# Patient Record
Sex: Female | Born: 1935
Health system: Southern US, Community
[De-identification: ages and names within clinical notes are randomized; demographics above are authoritative.]

## PROBLEM LIST (undated history)

## (undated) DIAGNOSIS — F329 Major depressive disorder, single episode, unspecified: Secondary | ICD-10-CM

## (undated) DIAGNOSIS — F32A Depression, unspecified: Secondary | ICD-10-CM

## (undated) DIAGNOSIS — Z9889 Other specified postprocedural states: Secondary | ICD-10-CM

## (undated) DIAGNOSIS — F419 Anxiety disorder, unspecified: Secondary | ICD-10-CM

## (undated) DIAGNOSIS — R112 Nausea with vomiting, unspecified: Secondary | ICD-10-CM

## (undated) HISTORY — PX: WISDOM TOOTH EXTRACTION: SHX21

## (undated) HISTORY — PX: TONSILLECTOMY: SUR1361

## (undated) HISTORY — PX: ABDOMINAL HYSTERECTOMY: SHX81

---

## 1999-06-18 ENCOUNTER — Other Ambulatory Visit: Admission: RE | Admit: 1999-06-18 | Discharge: 1999-06-18 | Payer: Self-pay | Admitting: Obstetrics and Gynecology

## 2000-05-04 ENCOUNTER — Other Ambulatory Visit: Admission: RE | Admit: 2000-05-04 | Discharge: 2000-05-04 | Payer: Self-pay | Admitting: Obstetrics and Gynecology

## 2002-01-17 ENCOUNTER — Encounter (INDEPENDENT_AMBULATORY_CARE_PROVIDER_SITE_OTHER): Payer: Self-pay | Admitting: Specialist

## 2002-01-17 ENCOUNTER — Ambulatory Visit (HOSPITAL_COMMUNITY): Admission: RE | Admit: 2002-01-17 | Discharge: 2002-01-17 | Payer: Self-pay | Admitting: Gastroenterology

## 2004-01-06 ENCOUNTER — Encounter: Admission: RE | Admit: 2004-01-06 | Discharge: 2004-01-06 | Payer: Self-pay | Admitting: Otolaryngology

## 2004-08-10 ENCOUNTER — Ambulatory Visit: Payer: Self-pay | Admitting: Internal Medicine

## 2004-09-04 ENCOUNTER — Ambulatory Visit: Payer: Self-pay | Admitting: Internal Medicine

## 2004-11-03 ENCOUNTER — Ambulatory Visit: Payer: Self-pay | Admitting: Internal Medicine

## 2004-12-25 ENCOUNTER — Ambulatory Visit: Payer: Self-pay | Admitting: Internal Medicine

## 2005-02-02 ENCOUNTER — Ambulatory Visit: Payer: Self-pay | Admitting: Internal Medicine

## 2005-04-01 ENCOUNTER — Ambulatory Visit: Payer: Self-pay | Admitting: Internal Medicine

## 2005-04-06 ENCOUNTER — Ambulatory Visit: Payer: Self-pay | Admitting: Internal Medicine

## 2005-05-03 ENCOUNTER — Ambulatory Visit: Payer: Self-pay | Admitting: Internal Medicine

## 2005-07-06 ENCOUNTER — Ambulatory Visit: Payer: Self-pay | Admitting: Internal Medicine

## 2005-08-23 ENCOUNTER — Ambulatory Visit: Payer: Self-pay | Admitting: Internal Medicine

## 2005-08-26 ENCOUNTER — Ambulatory Visit: Payer: Self-pay | Admitting: Internal Medicine

## 2005-12-02 ENCOUNTER — Ambulatory Visit: Payer: Self-pay | Admitting: Internal Medicine

## 2006-02-14 ENCOUNTER — Ambulatory Visit: Payer: Self-pay | Admitting: Internal Medicine

## 2006-04-05 ENCOUNTER — Ambulatory Visit: Payer: Self-pay | Admitting: Internal Medicine

## 2006-07-22 ENCOUNTER — Ambulatory Visit: Payer: Self-pay | Admitting: Internal Medicine

## 2006-07-25 ENCOUNTER — Ambulatory Visit: Payer: Self-pay | Admitting: Internal Medicine

## 2006-11-21 ENCOUNTER — Ambulatory Visit: Payer: Self-pay | Admitting: Internal Medicine

## 2007-02-01 ENCOUNTER — Ambulatory Visit: Payer: Self-pay | Admitting: Internal Medicine

## 2007-05-25 ENCOUNTER — Ambulatory Visit: Payer: Self-pay | Admitting: Internal Medicine

## 2007-11-22 DIAGNOSIS — J329 Chronic sinusitis, unspecified: Secondary | ICD-10-CM | POA: Insufficient documentation

## 2007-11-22 DIAGNOSIS — J309 Allergic rhinitis, unspecified: Secondary | ICD-10-CM | POA: Insufficient documentation

## 2007-11-23 ENCOUNTER — Ambulatory Visit: Payer: Self-pay | Admitting: Internal Medicine

## 2007-11-23 DIAGNOSIS — H6980 Other specified disorders of Eustachian tube, unspecified ear: Secondary | ICD-10-CM

## 2007-12-13 ENCOUNTER — Telehealth (INDEPENDENT_AMBULATORY_CARE_PROVIDER_SITE_OTHER): Payer: Self-pay | Admitting: *Deleted

## 2007-12-18 ENCOUNTER — Encounter: Payer: Self-pay | Admitting: Internal Medicine

## 2007-12-22 ENCOUNTER — Encounter: Payer: Self-pay | Admitting: Internal Medicine

## 2007-12-25 ENCOUNTER — Telehealth (INDEPENDENT_AMBULATORY_CARE_PROVIDER_SITE_OTHER): Payer: Self-pay | Admitting: *Deleted

## 2007-12-26 ENCOUNTER — Encounter: Payer: Self-pay | Admitting: Internal Medicine

## 2008-12-30 ENCOUNTER — Ambulatory Visit: Payer: Self-pay | Admitting: Internal Medicine

## 2008-12-30 DIAGNOSIS — E785 Hyperlipidemia, unspecified: Secondary | ICD-10-CM

## 2010-11-06 ENCOUNTER — Other Ambulatory Visit: Payer: Self-pay | Admitting: Dermatology

## 2010-12-01 NOTE — Assessment & Plan Note (Signed)
[facility]                             PULMONARY OFFICE NOTE   NAME:Ashley Mcneil, Ashley Mcneil                   MRN:          [id number]  DATE:05/25/2007                            DOB:          10-20-1935    PROBLEM:  1. Allergic rhinitis,.  2. Globus sensation and postnasal drainage.  3. Questionable occult esophageal reflux.   HISTORY:  Ashley Mcneil continues to be quite fixated on her sensation of  postnasal drip.  She describes getting up in the middle of the night and  working her throat to bring up some thick white mucous.  She had tried a  Z-pak and Mucinex without much benefit.  As we sit here this afternoon  she says she is so full of congestion and mess right this minute,  although it is not apparent in conversation.  She did not try saline  lavage as discussed after last visit but is interested now after a  patient suggested in the waiting room that it would be worthwhile.  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   MEDICATIONS:  1. She has continued allergy vaccine at 1:10.  2. Amitriptyline 10 mg for her interstitial cystitis.  3. Sinofresh nasal rinse which she considers her most useful therapy.  4. Paxil.  5. Restoril 15 mg.  6. Allegra 180 mg used intermittently.  7. Lovastatin 20 mg.  8. She does have an Epipen and has used Benadryl and Sudafed p.r.n.   ALLERGIES:  DRUG INTOLERANT TO PENICILLIN.   OBJECTIVE:  VITAL SIGNS:  Weight 124 pounds.  Blood pressure 118/60.  Pulse 87.  Room air saturation 98%.  HEENT:  Nasal mucosa and pharynx look normal.  Voice is unremarkable to  me with no stridor.  There is no visible pharyngeal irritation or  postnasal drip.  Conjunctivae are clear.  NECK:  There is no adenopathy or thyromegaly.  LUNG:  Lung fields are clear.  HEART:  Heart sounds normal.  GENERAL:  She does not cough or clear her throat during the time  I am  with her.   IMPRESSION:  She has gotten fixated on this and I even suggested some  distracting therapies including acupuncture and hypnosis. There may be a  component of wet throat but we had not been able to document formal  laryngopharyngeal reflux in the past.  There has been evidence of  esophageal reflux and she had seen Dr. Lazarus Salines in 2005 with  recommendation that she go back on b.i.d. Prevacid.  He had questioned  seasonal allergic rhinitis, probable reflux, possible habitual throat  clearing at that time.  Skin testing has usually been positive for some  common environmental allergens.  CT scan of the sinuses had been  negative and a swallowing evaluation done through Dr. Raye Sorrow office  reportedly was negative.   PLAN:  I have suggested she stop all of her med's except maybe the  Sinofresh and see how she feels.  I have suggested that she at least try  reducing the  Allegra to every other day.  She has a pamphlet and is  going to try a Neti pot with saline lavage per directions.  I have  raised the option of referral to a speech and swallowing second opinion  perhaps at [facility] and we have scheduled return now for six months,  earlier p.r.n.     Clinton D. Maple Hudson, MD, Tonny Bollman, FACP  Electronically Signed    CDY/MedQ  DD: 05/25/2007  DT: 05/26/2007  Job #: [id number]   cc:   Gloris Manchester. Lazarus Salines, M.D.  Jamison Neighbor, M.D.  Barry Dienes Eloise Harman, M.D.

## 2010-12-04 NOTE — Assessment & Plan Note (Signed)
[facility]                             PULMONARY OFFICE NOTE   NAME:Ashley Mcneil, Ashley Mcneil                   MRN:          [id number]  DATE:11/21/2006                            DOB:          April 23, 1936    PROBLEMS:  1. Allergic rhinitis.  2. Globus sensation and post nasal drainage.  3. Questionable occult esophageal reflux.   HISTORY:  She returns for follow-up this spring having found Xyzal made  no difference and no better than other antihistamines.  She had been  trying salt water spritz occasionally but never tried saline lavage as  we had discussed.  She says acid reflux work-up and medication trials  did not help.  Her complaint continues to be that she will wake with a  feeling of thick phlegm in the back of her throat.  She continues  allergy vaccine at 1 to 10 with no problems and we talked some today  about whether that was doing any measurable benefit for her.  She really  likes SinoFresh but is having trouble finding it.  We discussed  alternatives.   MEDICATIONS:  1. Allergy vaccine.  2. Amitriptyline 10 mg for interstitial cystitis.  3. Saline nasal spray.  4. SinoFresh b.i.d.  5. Paxil.  6. Benadryl p.r.n.  7. Occasional Sudafed.   DRUG INTOLERANCES:  PENICILLIN.   OBJECTIVE:  VITAL SIGNS:  Weight 114 pounds, blood pressure 112/62,  pulse 69, room air saturation 97%.  HEENT:  Voice quality is normal.  She breaths comfortably through her  nose.  Pharyngeal mucosal looks completely normal.  There is no stridor,  no adenopathy   IMPRESSION:  She is very sensitive to the feeling of mucus in her  throat.  I am not sure I can tie her complaint to objective findings.   PLAN:  1. Retry sample of Veramyst at her request, two sprays each nostril      once daily.  2. She is going to explore combinations of Mucinex with either an      antihistamine or a decongestant.  3. Try keeping bits of dry bread or cracker at the bedside to  help      with swallowing if she does wake and get up during the night as a      way to clear her throat mechanically.  4. We discussed saline nasal lavage in detail for trial.  5. Schedule return in six months, earlier p.r.n.     Clinton D. Maple Hudson, MD, Tonny Bollman, FACP  Electronically Signed    CDY/MedQ  DD: 11/21/2006  DT: 11/22/2006  Job #: [id number]   cc:   Geoffry Paradise, M.D.

## 2010-12-04 NOTE — Assessment & Plan Note (Signed)
[facility]                               PULMONARY OFFICE NOTE   NAME:Ashley Mcneil, Ashley Mcneil                   MRN:          [id number]  DATE:04/05/2006                            DOB:          [birth year]    PROBLEM LIST:  1. Allergic rhinitis.  2. Globus sensation and postnasal drainage.  3. Questionable occult esophageal reflux.   HISTORY:  Again complains of a sensation of chronic ongoing postnasal drip  and need to clear her throat.  She has been using an over-the-counter  Sudafed antihistamine-type combination.  She says this has been somewhat  different in the past year and she brings up herself again the question of  pharyngeal reflux which has never been confirmed.  She has begun using  Mucinex and water.  We discussed her history of several CT scans of the  sinuses, all negative.  She very much likes Sinofresh as a nasal spray and  rinse.   MEDICATIONS:  Allergy vaccine at 1:10.  She gives her own. We have again  reviewed risks and options, discussed policy concerning administration  outside of a medical office, anaphylaxis, epinephrine and refilled an  epinephrine pen for her.  Amitriptyline 10 mg for interstitial cystitis.  Sinofresh b.i.d., Nasalcrom, Benadryl or other antihistamine p.r.n.,  phenylephrine epi pen.   DRUG INTOLERANCE:  PENICILLIN.   OBJECTIVE:  Weight 124 pounds.  BP 100/60, pulse regular rate, room air  saturation 97%.  Posterior pharyngeal wall is rather glandular and nodular  without erythema or visible drainage.  There is no adenopathy.  Nasal airway  is not obstructed now.  Lungs are clear.  Voice quality is normal.  Heart  sound is regular.   IMPRESSION:  1. Allergic rhinitis.  2. Probable reflux.   PLAN:  1. Epinephrine and allergy vaccine safety review were again done.  2. Try Reglan 10 mg q.i.d. a.c. and h.s.  3. We discussed availability of speech therapy barium swallow and formal      reflux  evaluation.  4. I offered her a second opinion with the voice and speech people at      [facility] if she will consider.  5. Try Mucinex b.i.d.  6. Schedule a return three months, earlier p.r.n.                                   Clinton D. Maple Hudson, MD, [facility], FACP   CDY/MedQ  DD:  04/08/2006  DT:  04/10/2006  Job #:  [id number]   cc:   Geoffry Paradise, M.D.

## 2010-12-04 NOTE — Assessment & Plan Note (Signed)
[facility]                             PULMONARY OFFICE NOTE   NAME:Pyka, NEOMA UHRICH                   MRN:          [id number]  DATE:07/25/2006                            DOB:          07/20/1935    PROBLEM LIST:  1. Allergic rhinitis.  2. Globus sensation and postnasal drainage.  3. Questionable occult esophageal reflux.   HISTORY OF PRESENT ILLNESS:  She returns today saying she still has a  lot of congestion, although metoclopramide seemed to help at first.  She drifted away from regular use to using it more p.r.n. and could not  tell much with that. She wants to go back to trying it regularly 10 mg  before meals and at h.s. She uses Sudafed PE. She has been rinsing her  nose in the shower and using SinoFresh and nasal saline which she thinks  helped. She still is not aware of frank reflux, purulent discharge,  headache or wheeze. She continues allergy vaccine at 1 to 10.   MEDICATIONS:  1. Allergy shots.  2. Amitriptyline 10 mg for interstitial cystitis.  3. Saline nasal rinse.  4. SinoFresh at least b.i.d.  5. Metoclopramide 10 mg used as above.  6. Benadryl or occasional Claritin p.r.n.  7. She has an Epipen, drug intolerant to penicillin.   OBJECTIVE:  VITAL SIGNS:  Weight 124 pounds, BP 110/62, pulse 72, room  air saturation 97%.  HEENT:  Oropharynx is minimally glandular with no nasal congestion or  post nasal drip. No erythema. Voice quality seems normal. There is no  stridor, no sniffing, no coughing. I do not find cervical adenopathy.  LUNGS:  Lung fields are clear.  HEART:  Heart sounds normal.   IMPRESSION:  She is quite focused on this symptom without much objective  finding. There may be components of allergic rhinitis and  gastroesophageal reflux disease as previously speculated. She is going  to retry the metoclopramide at 10 mg a.c. and h.s. She is given samples  of Xyzall 5 mg to try one daily. If that is helpful,  she will try  Claritin or leftover samples of Extendryl. I had offered her referral to  get a [facility]. She will consider that later.  Schedule return in four months or earlier p.r.n.     Clinton D. Maple Hudson, MD, Tonny Bollman, FACP  Electronically Signed    CDY/MedQ  DD: 07/25/2006  DT: 07/26/2006  Job #: [id number]   cc:   Geoffry Paradise, M.D.

## 2010-12-04 NOTE — Procedures (Signed)
[facility]  Patient:    Ashley Mcneil, Ashley Mcneil Visit Number: [id number] MRN: [id number]          Service Type: END Location: ENDO Attending Physician:  Louie Bun Dictated by:   Everardo All Madilyn Fireman, M.D. Proc. Date: 01/17/02 Admit Date:  01/17/2002 Discharge Date: 01/17/2002   CC:         Barry Dienes. Eloise Harman, M.D.   Procedure Report  PROCEDURE:  Colonoscopy with polypectomy.  INDICATION FOR PROCEDURE:  Screening colonoscopy in a 75 year old patient with no prior colon screening.  DESCRIPTION OF PROCEDURE:  The patient was placed in the left lateral decubitus position then placed on the pulse monitor with continuous low flow oxygen delivered by nasal cannula. She was sedated with 60 mg IV Demerol and 6 mg IV Versed. The Olympus video colonoscope was inserted into the rectum and advanced to the cecum, confirmed by transillumination at McBurneys point and visualization of the ileocecal valve and appendiceal orifice. The prep was good. The cecum revealed a small 8 mm polyp in the base of the cecum which was fulgurated by hot biopsy. The remainder of the cecum, ascending, transverse, descending and sigmoid colon all appeared normal with no masses, polyps, diverticula or other mucosal abnormalities. The rectum likewise appeared normal and retroflexed view of the anus revealed no obvious internal hemorrhoids. The colonoscope was then withdrawn and the patient returned to the recovery room in stable condition. The patient tolerated the procedure well and there were no immediate complications.  IMPRESSION:  Small cecal polyp otherwise normal colonoscopy.  PLAN:  Await histology for determination of method and interval for future colon screening. Dictated by:   Everardo All Madilyn Fireman, M.D. Attending Physician:  Louie Bun DD:  01/17/02 TD:  01/20/02 Job: [id number]

## 2011-01-22 ENCOUNTER — Other Ambulatory Visit: Payer: Self-pay | Admitting: Dermatology

## 2012-01-03 DIAGNOSIS — L259 Unspecified contact dermatitis, unspecified cause: Secondary | ICD-10-CM | POA: Diagnosis not present

## 2012-01-03 DIAGNOSIS — L570 Actinic keratosis: Secondary | ICD-10-CM | POA: Diagnosis not present

## 2012-01-03 DIAGNOSIS — L408 Other psoriasis: Secondary | ICD-10-CM | POA: Diagnosis not present

## 2012-01-03 DIAGNOSIS — L219 Seborrheic dermatitis, unspecified: Secondary | ICD-10-CM | POA: Diagnosis not present

## 2012-01-11 DIAGNOSIS — L259 Unspecified contact dermatitis, unspecified cause: Secondary | ICD-10-CM | POA: Diagnosis not present

## 2012-01-11 DIAGNOSIS — L408 Other psoriasis: Secondary | ICD-10-CM | POA: Diagnosis not present

## 2012-01-11 DIAGNOSIS — L219 Seborrheic dermatitis, unspecified: Secondary | ICD-10-CM | POA: Diagnosis not present

## 2012-01-11 DIAGNOSIS — L570 Actinic keratosis: Secondary | ICD-10-CM | POA: Diagnosis not present

## 2012-01-31 DIAGNOSIS — L408 Other psoriasis: Secondary | ICD-10-CM | POA: Diagnosis not present

## 2012-02-15 ENCOUNTER — Other Ambulatory Visit: Payer: Self-pay | Admitting: Dermatology

## 2012-02-15 DIAGNOSIS — D485 Neoplasm of uncertain behavior of skin: Secondary | ICD-10-CM | POA: Diagnosis not present

## 2012-02-15 DIAGNOSIS — L408 Other psoriasis: Secondary | ICD-10-CM | POA: Diagnosis not present

## 2012-02-15 DIAGNOSIS — L570 Actinic keratosis: Secondary | ICD-10-CM | POA: Diagnosis not present

## 2012-03-03 DIAGNOSIS — L080 Pyoderma: Secondary | ICD-10-CM | POA: Diagnosis not present

## 2012-03-03 DIAGNOSIS — L219 Seborrheic dermatitis, unspecified: Secondary | ICD-10-CM | POA: Diagnosis not present

## 2012-03-21 DIAGNOSIS — L408 Other psoriasis: Secondary | ICD-10-CM | POA: Diagnosis not present

## 2012-05-05 DIAGNOSIS — L408 Other psoriasis: Secondary | ICD-10-CM | POA: Diagnosis not present

## 2012-05-05 DIAGNOSIS — L570 Actinic keratosis: Secondary | ICD-10-CM | POA: Diagnosis not present

## 2012-05-05 DIAGNOSIS — L820 Inflamed seborrheic keratosis: Secondary | ICD-10-CM | POA: Diagnosis not present

## 2012-05-05 DIAGNOSIS — L821 Other seborrheic keratosis: Secondary | ICD-10-CM | POA: Diagnosis not present

## 2012-05-15 DIAGNOSIS — H5200 Hypermetropia, unspecified eye: Secondary | ICD-10-CM | POA: Diagnosis not present

## 2012-05-15 DIAGNOSIS — H2510 Age-related nuclear cataract, unspecified eye: Secondary | ICD-10-CM | POA: Diagnosis not present

## 2012-05-29 DIAGNOSIS — Z0000 Encounter for general adult medical examination without abnormal findings: Secondary | ICD-10-CM | POA: Diagnosis not present

## 2012-05-29 DIAGNOSIS — Z124 Encounter for screening for malignant neoplasm of cervix: Secondary | ICD-10-CM | POA: Diagnosis not present

## 2012-05-29 DIAGNOSIS — Z01419 Encounter for gynecological examination (general) (routine) without abnormal findings: Secondary | ICD-10-CM | POA: Diagnosis not present

## 2012-05-31 DIAGNOSIS — E785 Hyperlipidemia, unspecified: Secondary | ICD-10-CM | POA: Diagnosis not present

## 2012-05-31 DIAGNOSIS — M949 Disorder of cartilage, unspecified: Secondary | ICD-10-CM | POA: Diagnosis not present

## 2012-05-31 DIAGNOSIS — M899 Disorder of bone, unspecified: Secondary | ICD-10-CM | POA: Diagnosis not present

## 2012-05-31 DIAGNOSIS — Z79899 Other long term (current) drug therapy: Secondary | ICD-10-CM | POA: Diagnosis not present

## 2012-06-05 DIAGNOSIS — E785 Hyperlipidemia, unspecified: Secondary | ICD-10-CM | POA: Diagnosis not present

## 2012-06-05 DIAGNOSIS — Z0000 Encounter for general adult medical examination without abnormal findings: Secondary | ICD-10-CM | POA: Diagnosis not present

## 2012-06-05 DIAGNOSIS — Z23 Encounter for immunization: Secondary | ICD-10-CM | POA: Diagnosis not present

## 2012-06-05 DIAGNOSIS — N182 Chronic kidney disease, stage 2 (mild): Secondary | ICD-10-CM | POA: Diagnosis not present

## 2012-06-05 DIAGNOSIS — M899 Disorder of bone, unspecified: Secondary | ICD-10-CM | POA: Diagnosis not present

## 2012-06-07 DIAGNOSIS — M899 Disorder of bone, unspecified: Secondary | ICD-10-CM | POA: Diagnosis not present

## 2012-06-07 DIAGNOSIS — M949 Disorder of cartilage, unspecified: Secondary | ICD-10-CM | POA: Diagnosis not present

## 2012-06-09 DIAGNOSIS — Z1212 Encounter for screening for malignant neoplasm of rectum: Secondary | ICD-10-CM | POA: Diagnosis not present

## 2012-06-19 DIAGNOSIS — L408 Other psoriasis: Secondary | ICD-10-CM | POA: Diagnosis not present

## 2012-06-19 DIAGNOSIS — L821 Other seborrheic keratosis: Secondary | ICD-10-CM | POA: Diagnosis not present

## 2012-07-24 DIAGNOSIS — L408 Other psoriasis: Secondary | ICD-10-CM | POA: Diagnosis not present

## 2012-07-24 DIAGNOSIS — D692 Other nonthrombocytopenic purpura: Secondary | ICD-10-CM | POA: Diagnosis not present

## 2012-07-24 DIAGNOSIS — L259 Unspecified contact dermatitis, unspecified cause: Secondary | ICD-10-CM | POA: Diagnosis not present

## 2012-08-21 DIAGNOSIS — L408 Other psoriasis: Secondary | ICD-10-CM | POA: Diagnosis not present

## 2012-09-15 DIAGNOSIS — L578 Other skin changes due to chronic exposure to nonionizing radiation: Secondary | ICD-10-CM | POA: Diagnosis not present

## 2012-09-15 DIAGNOSIS — L821 Other seborrheic keratosis: Secondary | ICD-10-CM | POA: Diagnosis not present

## 2012-09-15 DIAGNOSIS — L408 Other psoriasis: Secondary | ICD-10-CM | POA: Diagnosis not present

## 2012-09-15 DIAGNOSIS — L819 Disorder of pigmentation, unspecified: Secondary | ICD-10-CM | POA: Diagnosis not present

## 2012-09-27 DIAGNOSIS — L408 Other psoriasis: Secondary | ICD-10-CM | POA: Diagnosis not present

## 2012-10-09 ENCOUNTER — Other Ambulatory Visit: Payer: Self-pay | Admitting: Gastroenterology

## 2012-10-09 DIAGNOSIS — Z86010 Personal history of colonic polyps: Secondary | ICD-10-CM | POA: Diagnosis not present

## 2012-10-09 DIAGNOSIS — Z09 Encounter for follow-up examination after completed treatment for conditions other than malignant neoplasm: Secondary | ICD-10-CM | POA: Diagnosis not present

## 2012-10-09 DIAGNOSIS — D126 Benign neoplasm of colon, unspecified: Secondary | ICD-10-CM | POA: Diagnosis not present

## 2012-11-13 DIAGNOSIS — Z79899 Other long term (current) drug therapy: Secondary | ICD-10-CM | POA: Diagnosis not present

## 2012-11-13 DIAGNOSIS — L408 Other psoriasis: Secondary | ICD-10-CM | POA: Diagnosis not present

## 2012-11-29 DIAGNOSIS — Z1231 Encounter for screening mammogram for malignant neoplasm of breast: Secondary | ICD-10-CM | POA: Diagnosis not present

## 2012-12-08 DIAGNOSIS — L408 Other psoriasis: Secondary | ICD-10-CM | POA: Diagnosis not present

## 2013-01-18 DIAGNOSIS — L408 Other psoriasis: Secondary | ICD-10-CM | POA: Diagnosis not present

## 2013-01-18 DIAGNOSIS — L821 Other seborrheic keratosis: Secondary | ICD-10-CM | POA: Diagnosis not present

## 2013-01-18 DIAGNOSIS — D485 Neoplasm of uncertain behavior of skin: Secondary | ICD-10-CM | POA: Diagnosis not present

## 2013-01-18 DIAGNOSIS — Z79899 Other long term (current) drug therapy: Secondary | ICD-10-CM | POA: Diagnosis not present

## 2013-02-19 DIAGNOSIS — IMO0002 Reserved for concepts with insufficient information to code with codable children: Secondary | ICD-10-CM | POA: Diagnosis not present

## 2013-02-19 DIAGNOSIS — L089 Local infection of the skin and subcutaneous tissue, unspecified: Secondary | ICD-10-CM | POA: Diagnosis not present

## 2013-02-28 DIAGNOSIS — S8010XA Contusion of unspecified lower leg, initial encounter: Secondary | ICD-10-CM | POA: Diagnosis not present

## 2013-03-07 DIAGNOSIS — S8010XA Contusion of unspecified lower leg, initial encounter: Secondary | ICD-10-CM | POA: Diagnosis not present

## 2013-03-21 DIAGNOSIS — S8010XA Contusion of unspecified lower leg, initial encounter: Secondary | ICD-10-CM | POA: Diagnosis not present

## 2013-04-02 DIAGNOSIS — L821 Other seborrheic keratosis: Secondary | ICD-10-CM | POA: Diagnosis not present

## 2013-04-02 DIAGNOSIS — L97909 Non-pressure chronic ulcer of unspecified part of unspecified lower leg with unspecified severity: Secondary | ICD-10-CM | POA: Diagnosis not present

## 2013-04-02 DIAGNOSIS — Z79899 Other long term (current) drug therapy: Secondary | ICD-10-CM | POA: Diagnosis not present

## 2013-04-02 DIAGNOSIS — L408 Other psoriasis: Secondary | ICD-10-CM | POA: Diagnosis not present

## 2013-04-10 DIAGNOSIS — S8010XA Contusion of unspecified lower leg, initial encounter: Secondary | ICD-10-CM | POA: Diagnosis not present

## 2013-04-10 DIAGNOSIS — L02419 Cutaneous abscess of limb, unspecified: Secondary | ICD-10-CM | POA: Diagnosis not present

## 2013-04-16 DIAGNOSIS — S8010XA Contusion of unspecified lower leg, initial encounter: Secondary | ICD-10-CM | POA: Diagnosis not present

## 2013-04-16 DIAGNOSIS — L02419 Cutaneous abscess of limb, unspecified: Secondary | ICD-10-CM | POA: Diagnosis not present

## 2013-04-18 DIAGNOSIS — S8010XA Contusion of unspecified lower leg, initial encounter: Secondary | ICD-10-CM | POA: Diagnosis not present

## 2013-04-18 DIAGNOSIS — L02419 Cutaneous abscess of limb, unspecified: Secondary | ICD-10-CM | POA: Diagnosis not present

## 2013-04-25 DIAGNOSIS — L02419 Cutaneous abscess of limb, unspecified: Secondary | ICD-10-CM | POA: Diagnosis not present

## 2013-04-25 DIAGNOSIS — S8010XA Contusion of unspecified lower leg, initial encounter: Secondary | ICD-10-CM | POA: Diagnosis not present

## 2013-05-21 DIAGNOSIS — H52209 Unspecified astigmatism, unspecified eye: Secondary | ICD-10-CM | POA: Diagnosis not present

## 2013-05-21 DIAGNOSIS — H2510 Age-related nuclear cataract, unspecified eye: Secondary | ICD-10-CM | POA: Diagnosis not present

## 2013-05-29 DIAGNOSIS — L821 Other seborrheic keratosis: Secondary | ICD-10-CM | POA: Diagnosis not present

## 2013-05-29 DIAGNOSIS — L408 Other psoriasis: Secondary | ICD-10-CM | POA: Diagnosis not present

## 2013-05-29 DIAGNOSIS — Z79899 Other long term (current) drug therapy: Secondary | ICD-10-CM | POA: Diagnosis not present

## 2013-05-29 DIAGNOSIS — B079 Viral wart, unspecified: Secondary | ICD-10-CM | POA: Diagnosis not present

## 2013-06-04 ENCOUNTER — Ambulatory Visit: Payer: Self-pay | Admitting: Obstetrics and Gynecology

## 2013-06-13 DIAGNOSIS — R82998 Other abnormal findings in urine: Secondary | ICD-10-CM | POA: Diagnosis not present

## 2013-06-13 DIAGNOSIS — M899 Disorder of bone, unspecified: Secondary | ICD-10-CM | POA: Diagnosis not present

## 2013-06-13 DIAGNOSIS — N182 Chronic kidney disease, stage 2 (mild): Secondary | ICD-10-CM | POA: Diagnosis not present

## 2013-06-13 DIAGNOSIS — E785 Hyperlipidemia, unspecified: Secondary | ICD-10-CM | POA: Diagnosis not present

## 2013-06-20 DIAGNOSIS — E785 Hyperlipidemia, unspecified: Secondary | ICD-10-CM | POA: Diagnosis not present

## 2013-06-20 DIAGNOSIS — N182 Chronic kidney disease, stage 2 (mild): Secondary | ICD-10-CM | POA: Diagnosis not present

## 2013-06-20 DIAGNOSIS — Z79899 Other long term (current) drug therapy: Secondary | ICD-10-CM | POA: Diagnosis not present

## 2013-06-20 DIAGNOSIS — Z0000 Encounter for general adult medical examination without abnormal findings: Secondary | ICD-10-CM | POA: Diagnosis not present

## 2013-06-20 DIAGNOSIS — K5900 Constipation, unspecified: Secondary | ICD-10-CM | POA: Diagnosis not present

## 2013-06-20 DIAGNOSIS — F411 Generalized anxiety disorder: Secondary | ICD-10-CM | POA: Diagnosis not present

## 2013-06-20 DIAGNOSIS — Z1331 Encounter for screening for depression: Secondary | ICD-10-CM | POA: Diagnosis not present

## 2013-06-20 DIAGNOSIS — M899 Disorder of bone, unspecified: Secondary | ICD-10-CM | POA: Diagnosis not present

## 2013-07-04 DIAGNOSIS — N3010 Interstitial cystitis (chronic) without hematuria: Secondary | ICD-10-CM | POA: Diagnosis not present

## 2013-07-04 DIAGNOSIS — R319 Hematuria, unspecified: Secondary | ICD-10-CM | POA: Diagnosis not present

## 2013-07-04 DIAGNOSIS — Z1212 Encounter for screening for malignant neoplasm of rectum: Secondary | ICD-10-CM | POA: Diagnosis not present

## 2013-07-04 DIAGNOSIS — R351 Nocturia: Secondary | ICD-10-CM | POA: Diagnosis not present

## 2013-07-05 ENCOUNTER — Other Ambulatory Visit: Payer: Self-pay | Admitting: Urology

## 2013-07-05 DIAGNOSIS — R3129 Other microscopic hematuria: Secondary | ICD-10-CM

## 2013-07-09 ENCOUNTER — Ambulatory Visit
Admission: RE | Admit: 2013-07-09 | Discharge: 2013-07-09 | Disposition: A | Discharge status: Home / Self Care | Payer: Medicare Other | Source: Ambulatory Visit | Attending: Urology | Admitting: Urology

## 2013-07-09 DIAGNOSIS — R3129 Other microscopic hematuria: Secondary | ICD-10-CM | POA: Diagnosis not present

## 2013-09-17 DIAGNOSIS — L408 Other psoriasis: Secondary | ICD-10-CM | POA: Diagnosis not present

## 2013-09-17 DIAGNOSIS — L819 Disorder of pigmentation, unspecified: Secondary | ICD-10-CM | POA: Diagnosis not present

## 2013-09-17 DIAGNOSIS — L821 Other seborrheic keratosis: Secondary | ICD-10-CM | POA: Diagnosis not present

## 2013-09-28 DIAGNOSIS — R110 Nausea: Secondary | ICD-10-CM | POA: Diagnosis not present

## 2013-09-28 DIAGNOSIS — IMO0002 Reserved for concepts with insufficient information to code with codable children: Secondary | ICD-10-CM | POA: Diagnosis not present

## 2013-09-28 DIAGNOSIS — G4700 Insomnia, unspecified: Secondary | ICD-10-CM | POA: Diagnosis not present

## 2013-09-28 DIAGNOSIS — M25519 Pain in unspecified shoulder: Secondary | ICD-10-CM | POA: Diagnosis not present

## 2013-10-03 ENCOUNTER — Other Ambulatory Visit: Payer: Self-pay | Admitting: Internal Medicine

## 2013-10-03 DIAGNOSIS — M25511 Pain in right shoulder: Secondary | ICD-10-CM

## 2013-10-03 DIAGNOSIS — M542 Cervicalgia: Secondary | ICD-10-CM

## 2013-10-13 ENCOUNTER — Other Ambulatory Visit: Payer: Medicare Other

## 2013-10-29 DIAGNOSIS — L819 Disorder of pigmentation, unspecified: Secondary | ICD-10-CM | POA: Diagnosis not present

## 2013-10-29 DIAGNOSIS — L408 Other psoriasis: Secondary | ICD-10-CM | POA: Diagnosis not present

## 2013-10-29 DIAGNOSIS — L821 Other seborrheic keratosis: Secondary | ICD-10-CM | POA: Diagnosis not present

## 2014-01-14 DIAGNOSIS — L820 Inflamed seborrheic keratosis: Secondary | ICD-10-CM | POA: Diagnosis not present

## 2014-01-14 DIAGNOSIS — D1801 Hemangioma of skin and subcutaneous tissue: Secondary | ICD-10-CM | POA: Diagnosis not present

## 2014-01-14 DIAGNOSIS — L739 Follicular disorder, unspecified: Secondary | ICD-10-CM | POA: Diagnosis not present

## 2014-01-14 DIAGNOSIS — L723 Sebaceous cyst: Secondary | ICD-10-CM | POA: Diagnosis not present

## 2014-01-14 DIAGNOSIS — Q828 Other specified congenital malformations of skin: Secondary | ICD-10-CM | POA: Diagnosis not present

## 2014-01-14 DIAGNOSIS — D692 Other nonthrombocytopenic purpura: Secondary | ICD-10-CM | POA: Diagnosis not present

## 2014-01-14 DIAGNOSIS — L408 Other psoriasis: Secondary | ICD-10-CM | POA: Diagnosis not present

## 2014-01-14 DIAGNOSIS — L570 Actinic keratosis: Secondary | ICD-10-CM | POA: Diagnosis not present

## 2014-02-06 DIAGNOSIS — L570 Actinic keratosis: Secondary | ICD-10-CM | POA: Diagnosis not present

## 2014-05-07 DIAGNOSIS — L821 Other seborrheic keratosis: Secondary | ICD-10-CM | POA: Diagnosis not present

## 2014-05-07 DIAGNOSIS — L409 Psoriasis, unspecified: Secondary | ICD-10-CM | POA: Diagnosis not present

## 2014-05-07 DIAGNOSIS — L814 Other melanin hyperpigmentation: Secondary | ICD-10-CM | POA: Diagnosis not present

## 2014-05-22 DIAGNOSIS — H2513 Age-related nuclear cataract, bilateral: Secondary | ICD-10-CM | POA: Diagnosis not present

## 2014-05-22 DIAGNOSIS — H52203 Unspecified astigmatism, bilateral: Secondary | ICD-10-CM | POA: Diagnosis not present

## 2014-06-17 DIAGNOSIS — Z0000 Encounter for general adult medical examination without abnormal findings: Secondary | ICD-10-CM | POA: Diagnosis not present

## 2014-06-17 DIAGNOSIS — M859 Disorder of bone density and structure, unspecified: Secondary | ICD-10-CM | POA: Diagnosis not present

## 2014-06-17 DIAGNOSIS — E785 Hyperlipidemia, unspecified: Secondary | ICD-10-CM | POA: Diagnosis not present

## 2014-07-10 DIAGNOSIS — L409 Psoriasis, unspecified: Secondary | ICD-10-CM | POA: Diagnosis not present

## 2014-07-10 DIAGNOSIS — Z6823 Body mass index (BMI) 23.0-23.9, adult: Secondary | ICD-10-CM | POA: Diagnosis not present

## 2014-07-10 DIAGNOSIS — N183 Chronic kidney disease, stage 3 (moderate): Secondary | ICD-10-CM | POA: Diagnosis not present

## 2014-07-10 DIAGNOSIS — N3010 Interstitial cystitis (chronic) without hematuria: Secondary | ICD-10-CM | POA: Diagnosis not present

## 2014-07-10 DIAGNOSIS — M8580 Other specified disorders of bone density and structure, unspecified site: Secondary | ICD-10-CM | POA: Diagnosis not present

## 2014-07-10 DIAGNOSIS — E785 Hyperlipidemia, unspecified: Secondary | ICD-10-CM | POA: Diagnosis not present

## 2014-07-10 DIAGNOSIS — Z1389 Encounter for screening for other disorder: Secondary | ICD-10-CM | POA: Diagnosis not present

## 2014-07-10 DIAGNOSIS — Z0000 Encounter for general adult medical examination without abnormal findings: Secondary | ICD-10-CM | POA: Diagnosis not present

## 2014-07-10 DIAGNOSIS — Z23 Encounter for immunization: Secondary | ICD-10-CM | POA: Diagnosis not present

## 2014-09-12 DIAGNOSIS — Z1212 Encounter for screening for malignant neoplasm of rectum: Secondary | ICD-10-CM | POA: Diagnosis not present

## 2014-09-18 DIAGNOSIS — Z1231 Encounter for screening mammogram for malignant neoplasm of breast: Secondary | ICD-10-CM | POA: Diagnosis not present

## 2014-09-18 DIAGNOSIS — Z803 Family history of malignant neoplasm of breast: Secondary | ICD-10-CM | POA: Diagnosis not present

## 2014-09-30 DIAGNOSIS — H905 Unspecified sensorineural hearing loss: Secondary | ICD-10-CM | POA: Diagnosis not present

## 2014-10-23 DIAGNOSIS — L821 Other seborrheic keratosis: Secondary | ICD-10-CM | POA: Diagnosis not present

## 2014-10-23 DIAGNOSIS — C801 Malignant (primary) neoplasm, unspecified: Secondary | ICD-10-CM | POA: Diagnosis not present

## 2014-10-23 DIAGNOSIS — D489 Neoplasm of uncertain behavior, unspecified: Secondary | ICD-10-CM | POA: Diagnosis not present

## 2014-10-23 DIAGNOSIS — L409 Psoriasis, unspecified: Secondary | ICD-10-CM | POA: Diagnosis not present

## 2014-10-23 DIAGNOSIS — L814 Other melanin hyperpigmentation: Secondary | ICD-10-CM | POA: Diagnosis not present

## 2014-12-09 DIAGNOSIS — M79676 Pain in unspecified toe(s): Secondary | ICD-10-CM | POA: Diagnosis not present

## 2014-12-09 DIAGNOSIS — S99921A Unspecified injury of right foot, initial encounter: Secondary | ICD-10-CM | POA: Diagnosis not present

## 2014-12-09 DIAGNOSIS — M79632 Pain in left forearm: Secondary | ICD-10-CM | POA: Diagnosis not present

## 2014-12-09 DIAGNOSIS — M169 Osteoarthritis of hip, unspecified: Secondary | ICD-10-CM | POA: Diagnosis not present

## 2014-12-09 DIAGNOSIS — Z6823 Body mass index (BMI) 23.0-23.9, adult: Secondary | ICD-10-CM | POA: Diagnosis not present

## 2014-12-09 DIAGNOSIS — T148 Other injury of unspecified body region: Secondary | ICD-10-CM | POA: Diagnosis not present

## 2015-01-02 DIAGNOSIS — M5136 Other intervertebral disc degeneration, lumbar region: Secondary | ICD-10-CM | POA: Diagnosis not present

## 2015-01-02 DIAGNOSIS — M419 Scoliosis, unspecified: Secondary | ICD-10-CM | POA: Diagnosis not present

## 2015-01-27 DIAGNOSIS — L409 Psoriasis, unspecified: Secondary | ICD-10-CM | POA: Diagnosis not present

## 2015-01-27 DIAGNOSIS — L814 Other melanin hyperpigmentation: Secondary | ICD-10-CM | POA: Diagnosis not present

## 2015-01-27 DIAGNOSIS — L821 Other seborrheic keratosis: Secondary | ICD-10-CM | POA: Diagnosis not present

## 2015-01-27 DIAGNOSIS — Z85828 Personal history of other malignant neoplasm of skin: Secondary | ICD-10-CM | POA: Diagnosis not present

## 2015-01-29 DIAGNOSIS — M5441 Lumbago with sciatica, right side: Secondary | ICD-10-CM | POA: Diagnosis not present

## 2015-02-25 DIAGNOSIS — M7061 Trochanteric bursitis, right hip: Secondary | ICD-10-CM | POA: Diagnosis not present

## 2015-02-25 DIAGNOSIS — M545 Low back pain: Secondary | ICD-10-CM | POA: Diagnosis not present

## 2015-02-25 DIAGNOSIS — M5441 Lumbago with sciatica, right side: Secondary | ICD-10-CM | POA: Diagnosis not present

## 2015-02-27 DIAGNOSIS — M7061 Trochanteric bursitis, right hip: Secondary | ICD-10-CM | POA: Diagnosis not present

## 2015-02-27 DIAGNOSIS — M5441 Lumbago with sciatica, right side: Secondary | ICD-10-CM | POA: Diagnosis not present

## 2015-02-27 DIAGNOSIS — M545 Low back pain: Secondary | ICD-10-CM | POA: Diagnosis not present

## 2015-03-05 DIAGNOSIS — M5441 Lumbago with sciatica, right side: Secondary | ICD-10-CM | POA: Diagnosis not present

## 2015-03-05 DIAGNOSIS — M545 Low back pain: Secondary | ICD-10-CM | POA: Diagnosis not present

## 2015-03-05 DIAGNOSIS — L0889 Other specified local infections of the skin and subcutaneous tissue: Secondary | ICD-10-CM | POA: Diagnosis not present

## 2015-03-05 DIAGNOSIS — M7061 Trochanteric bursitis, right hip: Secondary | ICD-10-CM | POA: Diagnosis not present

## 2015-03-05 DIAGNOSIS — L03011 Cellulitis of right finger: Secondary | ICD-10-CM | POA: Diagnosis not present

## 2015-03-10 DIAGNOSIS — M5441 Lumbago with sciatica, right side: Secondary | ICD-10-CM | POA: Diagnosis not present

## 2015-03-10 DIAGNOSIS — M7061 Trochanteric bursitis, right hip: Secondary | ICD-10-CM | POA: Diagnosis not present

## 2015-03-10 DIAGNOSIS — M545 Low back pain: Secondary | ICD-10-CM | POA: Diagnosis not present

## 2015-03-13 DIAGNOSIS — M545 Low back pain: Secondary | ICD-10-CM | POA: Diagnosis not present

## 2015-03-13 DIAGNOSIS — M7061 Trochanteric bursitis, right hip: Secondary | ICD-10-CM | POA: Diagnosis not present

## 2015-03-13 DIAGNOSIS — M5441 Lumbago with sciatica, right side: Secondary | ICD-10-CM | POA: Diagnosis not present

## 2015-03-14 DIAGNOSIS — H905 Unspecified sensorineural hearing loss: Secondary | ICD-10-CM | POA: Diagnosis not present

## 2015-03-19 DIAGNOSIS — M5441 Lumbago with sciatica, right side: Secondary | ICD-10-CM | POA: Diagnosis not present

## 2015-03-25 DIAGNOSIS — M5441 Lumbago with sciatica, right side: Secondary | ICD-10-CM | POA: Diagnosis not present

## 2015-03-25 DIAGNOSIS — M545 Low back pain: Secondary | ICD-10-CM | POA: Diagnosis not present

## 2015-03-25 DIAGNOSIS — M7061 Trochanteric bursitis, right hip: Secondary | ICD-10-CM | POA: Diagnosis not present

## 2015-03-27 DIAGNOSIS — M7061 Trochanteric bursitis, right hip: Secondary | ICD-10-CM | POA: Diagnosis not present

## 2015-03-27 DIAGNOSIS — M5441 Lumbago with sciatica, right side: Secondary | ICD-10-CM | POA: Diagnosis not present

## 2015-03-27 DIAGNOSIS — M545 Low back pain: Secondary | ICD-10-CM | POA: Diagnosis not present

## 2015-04-01 DIAGNOSIS — R351 Nocturia: Secondary | ICD-10-CM | POA: Diagnosis not present

## 2015-04-01 DIAGNOSIS — N3011 Interstitial cystitis (chronic) with hematuria: Secondary | ICD-10-CM | POA: Diagnosis not present

## 2015-04-01 DIAGNOSIS — R350 Frequency of micturition: Secondary | ICD-10-CM | POA: Diagnosis not present

## 2015-04-01 DIAGNOSIS — M545 Low back pain: Secondary | ICD-10-CM | POA: Diagnosis not present

## 2015-04-01 DIAGNOSIS — M7061 Trochanteric bursitis, right hip: Secondary | ICD-10-CM | POA: Diagnosis not present

## 2015-04-01 DIAGNOSIS — M5441 Lumbago with sciatica, right side: Secondary | ICD-10-CM | POA: Diagnosis not present

## 2015-04-04 DIAGNOSIS — M5441 Lumbago with sciatica, right side: Secondary | ICD-10-CM | POA: Diagnosis not present

## 2015-04-04 DIAGNOSIS — M545 Low back pain: Secondary | ICD-10-CM | POA: Diagnosis not present

## 2015-04-04 DIAGNOSIS — M7061 Trochanteric bursitis, right hip: Secondary | ICD-10-CM | POA: Diagnosis not present

## 2015-04-08 DIAGNOSIS — M5441 Lumbago with sciatica, right side: Secondary | ICD-10-CM | POA: Diagnosis not present

## 2015-04-08 DIAGNOSIS — M7061 Trochanteric bursitis, right hip: Secondary | ICD-10-CM | POA: Diagnosis not present

## 2015-04-08 DIAGNOSIS — M545 Low back pain: Secondary | ICD-10-CM | POA: Diagnosis not present

## 2015-04-11 DIAGNOSIS — M5441 Lumbago with sciatica, right side: Secondary | ICD-10-CM | POA: Diagnosis not present

## 2015-04-11 DIAGNOSIS — M5136 Other intervertebral disc degeneration, lumbar region: Secondary | ICD-10-CM | POA: Diagnosis not present

## 2015-04-16 DIAGNOSIS — M545 Low back pain: Secondary | ICD-10-CM | POA: Diagnosis not present

## 2015-04-16 DIAGNOSIS — M5441 Lumbago with sciatica, right side: Secondary | ICD-10-CM | POA: Diagnosis not present

## 2015-04-16 DIAGNOSIS — M7061 Trochanteric bursitis, right hip: Secondary | ICD-10-CM | POA: Diagnosis not present

## 2015-04-22 DIAGNOSIS — M5416 Radiculopathy, lumbar region: Secondary | ICD-10-CM | POA: Diagnosis not present

## 2015-04-24 DIAGNOSIS — M545 Low back pain: Secondary | ICD-10-CM | POA: Diagnosis not present

## 2015-04-24 DIAGNOSIS — M5441 Lumbago with sciatica, right side: Secondary | ICD-10-CM | POA: Diagnosis not present

## 2015-04-24 DIAGNOSIS — M7061 Trochanteric bursitis, right hip: Secondary | ICD-10-CM | POA: Diagnosis not present

## 2015-05-08 DIAGNOSIS — M545 Low back pain: Secondary | ICD-10-CM | POA: Diagnosis not present

## 2015-05-08 DIAGNOSIS — M7061 Trochanteric bursitis, right hip: Secondary | ICD-10-CM | POA: Diagnosis not present

## 2015-05-08 DIAGNOSIS — M5441 Lumbago with sciatica, right side: Secondary | ICD-10-CM | POA: Diagnosis not present

## 2015-05-09 DIAGNOSIS — M419 Scoliosis, unspecified: Secondary | ICD-10-CM | POA: Diagnosis not present

## 2015-05-09 DIAGNOSIS — M5136 Other intervertebral disc degeneration, lumbar region: Secondary | ICD-10-CM | POA: Diagnosis not present

## 2015-05-09 DIAGNOSIS — M5441 Lumbago with sciatica, right side: Secondary | ICD-10-CM | POA: Diagnosis not present

## 2015-05-15 DIAGNOSIS — M5441 Lumbago with sciatica, right side: Secondary | ICD-10-CM | POA: Diagnosis not present

## 2015-05-15 DIAGNOSIS — M7061 Trochanteric bursitis, right hip: Secondary | ICD-10-CM | POA: Diagnosis not present

## 2015-05-15 DIAGNOSIS — M545 Low back pain: Secondary | ICD-10-CM | POA: Diagnosis not present

## 2015-05-26 ENCOUNTER — Encounter: Payer: Self-pay | Admitting: Internal Medicine

## 2015-05-28 DIAGNOSIS — H2513 Age-related nuclear cataract, bilateral: Secondary | ICD-10-CM | POA: Diagnosis not present

## 2015-05-28 DIAGNOSIS — H52203 Unspecified astigmatism, bilateral: Secondary | ICD-10-CM | POA: Diagnosis not present

## 2015-05-29 DIAGNOSIS — Z6820 Body mass index (BMI) 20.0-20.9, adult: Secondary | ICD-10-CM | POA: Diagnosis not present

## 2015-05-29 DIAGNOSIS — R634 Abnormal weight loss: Secondary | ICD-10-CM | POA: Diagnosis not present

## 2015-05-29 DIAGNOSIS — Z0000 Encounter for general adult medical examination without abnormal findings: Secondary | ICD-10-CM | POA: Diagnosis not present

## 2015-05-29 DIAGNOSIS — Z23 Encounter for immunization: Secondary | ICD-10-CM | POA: Diagnosis not present

## 2015-05-29 DIAGNOSIS — F418 Other specified anxiety disorders: Secondary | ICD-10-CM | POA: Diagnosis not present

## 2015-06-27 ENCOUNTER — Encounter: Payer: Self-pay | Admitting: Internal Medicine

## 2015-07-09 DIAGNOSIS — M25551 Pain in right hip: Secondary | ICD-10-CM | POA: Diagnosis not present

## 2015-07-09 DIAGNOSIS — Z6820 Body mass index (BMI) 20.0-20.9, adult: Secondary | ICD-10-CM | POA: Diagnosis not present

## 2015-07-09 DIAGNOSIS — R634 Abnormal weight loss: Secondary | ICD-10-CM | POA: Diagnosis not present

## 2015-07-09 DIAGNOSIS — F418 Other specified anxiety disorders: Secondary | ICD-10-CM | POA: Diagnosis not present

## 2015-08-08 DIAGNOSIS — M5441 Lumbago with sciatica, right side: Secondary | ICD-10-CM | POA: Diagnosis not present

## 2015-08-08 DIAGNOSIS — M5136 Other intervertebral disc degeneration, lumbar region: Secondary | ICD-10-CM | POA: Diagnosis not present

## 2015-08-11 ENCOUNTER — Encounter: Payer: Self-pay | Admitting: Respiratory Therapy

## 2015-08-11 DIAGNOSIS — J8410 Pulmonary fibrosis, unspecified: Secondary | ICD-10-CM

## 2015-08-18 DIAGNOSIS — L814 Other melanin hyperpigmentation: Secondary | ICD-10-CM | POA: Diagnosis not present

## 2015-08-18 DIAGNOSIS — L821 Other seborrheic keratosis: Secondary | ICD-10-CM | POA: Diagnosis not present

## 2015-08-18 DIAGNOSIS — Z85828 Personal history of other malignant neoplasm of skin: Secondary | ICD-10-CM | POA: Diagnosis not present

## 2015-08-18 DIAGNOSIS — L409 Psoriasis, unspecified: Secondary | ICD-10-CM | POA: Diagnosis not present

## 2015-08-27 DIAGNOSIS — M859 Disorder of bone density and structure, unspecified: Secondary | ICD-10-CM | POA: Diagnosis not present

## 2015-08-27 DIAGNOSIS — E784 Other hyperlipidemia: Secondary | ICD-10-CM | POA: Diagnosis not present

## 2015-08-27 DIAGNOSIS — N183 Chronic kidney disease, stage 3 (moderate): Secondary | ICD-10-CM | POA: Diagnosis not present

## 2015-08-27 DIAGNOSIS — R8299 Other abnormal findings in urine: Secondary | ICD-10-CM | POA: Diagnosis not present

## 2015-08-29 DIAGNOSIS — H52203 Unspecified astigmatism, bilateral: Secondary | ICD-10-CM | POA: Diagnosis not present

## 2015-08-29 DIAGNOSIS — H2513 Age-related nuclear cataract, bilateral: Secondary | ICD-10-CM | POA: Diagnosis not present

## 2015-09-02 DIAGNOSIS — R634 Abnormal weight loss: Secondary | ICD-10-CM | POA: Diagnosis not present

## 2015-09-02 DIAGNOSIS — R3129 Other microscopic hematuria: Secondary | ICD-10-CM | POA: Diagnosis not present

## 2015-09-02 DIAGNOSIS — E784 Other hyperlipidemia: Secondary | ICD-10-CM | POA: Diagnosis not present

## 2015-09-02 DIAGNOSIS — M859 Disorder of bone density and structure, unspecified: Secondary | ICD-10-CM | POA: Diagnosis not present

## 2015-09-02 DIAGNOSIS — K5909 Other constipation: Secondary | ICD-10-CM | POA: Diagnosis not present

## 2015-09-02 DIAGNOSIS — Z0000 Encounter for general adult medical examination without abnormal findings: Secondary | ICD-10-CM | POA: Diagnosis not present

## 2015-09-02 DIAGNOSIS — F418 Other specified anxiety disorders: Secondary | ICD-10-CM | POA: Diagnosis not present

## 2015-09-02 DIAGNOSIS — Z6820 Body mass index (BMI) 20.0-20.9, adult: Secondary | ICD-10-CM | POA: Diagnosis not present

## 2015-09-02 DIAGNOSIS — Z1389 Encounter for screening for other disorder: Secondary | ICD-10-CM | POA: Diagnosis not present

## 2015-09-02 DIAGNOSIS — Z23 Encounter for immunization: Secondary | ICD-10-CM | POA: Diagnosis not present

## 2015-09-17 DIAGNOSIS — F331 Major depressive disorder, recurrent, moderate: Secondary | ICD-10-CM | POA: Diagnosis not present

## 2015-10-24 DIAGNOSIS — F331 Major depressive disorder, recurrent, moderate: Secondary | ICD-10-CM | POA: Diagnosis not present

## 2015-10-28 DIAGNOSIS — Z803 Family history of malignant neoplasm of breast: Secondary | ICD-10-CM | POA: Diagnosis not present

## 2015-10-28 DIAGNOSIS — Z1231 Encounter for screening mammogram for malignant neoplasm of breast: Secondary | ICD-10-CM | POA: Diagnosis not present

## 2015-10-29 DIAGNOSIS — F331 Major depressive disorder, recurrent, moderate: Secondary | ICD-10-CM | POA: Diagnosis not present

## 2015-10-29 DIAGNOSIS — F419 Anxiety disorder, unspecified: Secondary | ICD-10-CM | POA: Diagnosis not present

## 2015-11-07 DIAGNOSIS — M545 Low back pain: Secondary | ICD-10-CM | POA: Diagnosis not present

## 2015-11-07 DIAGNOSIS — Z681 Body mass index (BMI) 19 or less, adult: Secondary | ICD-10-CM | POA: Diagnosis not present

## 2015-11-07 DIAGNOSIS — F418 Other specified anxiety disorders: Secondary | ICD-10-CM | POA: Diagnosis not present

## 2015-11-07 DIAGNOSIS — R634 Abnormal weight loss: Secondary | ICD-10-CM | POA: Diagnosis not present

## 2015-11-07 DIAGNOSIS — R2681 Unsteadiness on feet: Secondary | ICD-10-CM | POA: Diagnosis not present

## 2015-11-12 DIAGNOSIS — F419 Anxiety disorder, unspecified: Secondary | ICD-10-CM | POA: Diagnosis not present

## 2015-11-12 DIAGNOSIS — F331 Major depressive disorder, recurrent, moderate: Secondary | ICD-10-CM | POA: Diagnosis not present

## 2015-11-14 DIAGNOSIS — M7061 Trochanteric bursitis, right hip: Secondary | ICD-10-CM | POA: Diagnosis not present

## 2015-11-19 DIAGNOSIS — R2681 Unsteadiness on feet: Secondary | ICD-10-CM | POA: Diagnosis not present

## 2015-11-19 DIAGNOSIS — M545 Low back pain: Secondary | ICD-10-CM | POA: Diagnosis not present

## 2015-11-19 DIAGNOSIS — M25551 Pain in right hip: Secondary | ICD-10-CM | POA: Diagnosis not present

## 2015-11-24 DIAGNOSIS — F331 Major depressive disorder, recurrent, moderate: Secondary | ICD-10-CM | POA: Diagnosis not present

## 2015-12-03 DIAGNOSIS — M25551 Pain in right hip: Secondary | ICD-10-CM | POA: Diagnosis not present

## 2015-12-03 DIAGNOSIS — M545 Low back pain: Secondary | ICD-10-CM | POA: Diagnosis not present

## 2015-12-03 DIAGNOSIS — R2681 Unsteadiness on feet: Secondary | ICD-10-CM | POA: Diagnosis not present

## 2015-12-12 DIAGNOSIS — M545 Low back pain: Secondary | ICD-10-CM | POA: Diagnosis not present

## 2015-12-12 DIAGNOSIS — R2681 Unsteadiness on feet: Secondary | ICD-10-CM | POA: Diagnosis not present

## 2015-12-12 DIAGNOSIS — M25551 Pain in right hip: Secondary | ICD-10-CM | POA: Diagnosis not present

## 2015-12-30 DIAGNOSIS — R351 Nocturia: Secondary | ICD-10-CM | POA: Diagnosis not present

## 2015-12-30 DIAGNOSIS — R3129 Other microscopic hematuria: Secondary | ICD-10-CM | POA: Diagnosis not present

## 2016-01-19 DIAGNOSIS — L814 Other melanin hyperpigmentation: Secondary | ICD-10-CM | POA: Diagnosis not present

## 2016-01-19 DIAGNOSIS — Z85828 Personal history of other malignant neoplasm of skin: Secondary | ICD-10-CM | POA: Diagnosis not present

## 2016-01-19 DIAGNOSIS — L821 Other seborrheic keratosis: Secondary | ICD-10-CM | POA: Diagnosis not present

## 2016-01-19 DIAGNOSIS — L409 Psoriasis, unspecified: Secondary | ICD-10-CM | POA: Diagnosis not present

## 2016-01-27 DIAGNOSIS — R3129 Other microscopic hematuria: Secondary | ICD-10-CM | POA: Diagnosis not present

## 2016-01-27 DIAGNOSIS — R319 Hematuria, unspecified: Secondary | ICD-10-CM | POA: Diagnosis not present

## 2016-01-27 DIAGNOSIS — R351 Nocturia: Secondary | ICD-10-CM | POA: Diagnosis not present

## 2016-02-04 DIAGNOSIS — M545 Low back pain: Secondary | ICD-10-CM | POA: Diagnosis not present

## 2016-02-04 DIAGNOSIS — R2681 Unsteadiness on feet: Secondary | ICD-10-CM | POA: Diagnosis not present

## 2016-02-04 DIAGNOSIS — M25551 Pain in right hip: Secondary | ICD-10-CM | POA: Diagnosis not present

## 2016-03-08 DIAGNOSIS — R634 Abnormal weight loss: Secondary | ICD-10-CM | POA: Diagnosis not present

## 2016-03-08 DIAGNOSIS — H9193 Unspecified hearing loss, bilateral: Secondary | ICD-10-CM | POA: Diagnosis not present

## 2016-03-08 DIAGNOSIS — R2681 Unsteadiness on feet: Secondary | ICD-10-CM | POA: Diagnosis not present

## 2016-03-08 DIAGNOSIS — F418 Other specified anxiety disorders: Secondary | ICD-10-CM | POA: Diagnosis not present

## 2016-03-08 DIAGNOSIS — Z681 Body mass index (BMI) 19 or less, adult: Secondary | ICD-10-CM | POA: Diagnosis not present

## 2016-03-08 DIAGNOSIS — M545 Low back pain: Secondary | ICD-10-CM | POA: Diagnosis not present

## 2016-03-09 DIAGNOSIS — F331 Major depressive disorder, recurrent, moderate: Secondary | ICD-10-CM | POA: Diagnosis not present

## 2016-03-18 DIAGNOSIS — F418 Other specified anxiety disorders: Secondary | ICD-10-CM | POA: Diagnosis not present

## 2016-03-26 DIAGNOSIS — F418 Other specified anxiety disorders: Secondary | ICD-10-CM | POA: Diagnosis not present

## 2016-04-08 DIAGNOSIS — F418 Other specified anxiety disorders: Secondary | ICD-10-CM | POA: Diagnosis not present

## 2016-05-10 DIAGNOSIS — H905 Unspecified sensorineural hearing loss: Secondary | ICD-10-CM | POA: Diagnosis not present

## 2016-05-13 DIAGNOSIS — F418 Other specified anxiety disorders: Secondary | ICD-10-CM | POA: Diagnosis not present

## 2016-05-17 DIAGNOSIS — F331 Major depressive disorder, recurrent, moderate: Secondary | ICD-10-CM | POA: Diagnosis not present

## 2016-06-07 DIAGNOSIS — L720 Epidermal cyst: Secondary | ICD-10-CM | POA: Diagnosis not present

## 2016-06-07 DIAGNOSIS — L853 Xerosis cutis: Secondary | ICD-10-CM | POA: Diagnosis not present

## 2016-06-07 DIAGNOSIS — H52203 Unspecified astigmatism, bilateral: Secondary | ICD-10-CM | POA: Diagnosis not present

## 2016-06-07 DIAGNOSIS — L820 Inflamed seborrheic keratosis: Secondary | ICD-10-CM | POA: Diagnosis not present

## 2016-06-07 DIAGNOSIS — L821 Other seborrheic keratosis: Secondary | ICD-10-CM | POA: Diagnosis not present

## 2016-06-07 DIAGNOSIS — L400 Psoriasis vulgaris: Secondary | ICD-10-CM | POA: Diagnosis not present

## 2016-06-07 DIAGNOSIS — L570 Actinic keratosis: Secondary | ICD-10-CM | POA: Diagnosis not present

## 2016-06-07 DIAGNOSIS — H2513 Age-related nuclear cataract, bilateral: Secondary | ICD-10-CM | POA: Diagnosis not present

## 2016-06-22 DIAGNOSIS — M7062 Trochanteric bursitis, left hip: Secondary | ICD-10-CM | POA: Diagnosis not present

## 2016-06-22 DIAGNOSIS — M5136 Other intervertebral disc degeneration, lumbar region: Secondary | ICD-10-CM | POA: Diagnosis not present

## 2016-06-22 DIAGNOSIS — M7061 Trochanteric bursitis, right hip: Secondary | ICD-10-CM | POA: Diagnosis not present

## 2016-06-23 DIAGNOSIS — Z23 Encounter for immunization: Secondary | ICD-10-CM | POA: Diagnosis not present

## 2016-07-05 DIAGNOSIS — F331 Major depressive disorder, recurrent, moderate: Secondary | ICD-10-CM | POA: Diagnosis not present

## 2016-07-06 DIAGNOSIS — M79641 Pain in right hand: Secondary | ICD-10-CM | POA: Diagnosis not present

## 2016-07-06 DIAGNOSIS — W540XXA Bitten by dog, initial encounter: Secondary | ICD-10-CM | POA: Diagnosis not present

## 2016-07-07 ENCOUNTER — Ambulatory Visit (HOSPITAL_COMMUNITY)
Admission: AD | Admit: 2016-07-07 | Discharge: 2016-07-07 | Disposition: A | Discharge status: Home / Self Care | Payer: Medicare Other | Source: Ambulatory Visit | Attending: Orthopedic Surgery | Admitting: Orthopedic Surgery

## 2016-07-07 ENCOUNTER — Ambulatory Visit (HOSPITAL_COMMUNITY): Payer: Medicare Other | Admitting: Anesthesiology

## 2016-07-07 ENCOUNTER — Encounter (HOSPITAL_COMMUNITY): Payer: Self-pay | Admitting: *Deleted

## 2016-07-07 ENCOUNTER — Other Ambulatory Visit: Payer: Self-pay | Admitting: Orthopedic Surgery

## 2016-07-07 ENCOUNTER — Encounter (HOSPITAL_COMMUNITY)
Admission: AD | Disposition: A | Discharge status: Home / Self Care | Payer: Self-pay | Source: Ambulatory Visit | Attending: Orthopedic Surgery

## 2016-07-07 DIAGNOSIS — Z880 Allergy status to penicillin: Secondary | ICD-10-CM | POA: Insufficient documentation

## 2016-07-07 DIAGNOSIS — Z803 Family history of malignant neoplasm of breast: Secondary | ICD-10-CM | POA: Diagnosis not present

## 2016-07-07 DIAGNOSIS — Z818 Family history of other mental and behavioral disorders: Secondary | ICD-10-CM | POA: Diagnosis not present

## 2016-07-07 DIAGNOSIS — Y939 Activity, unspecified: Secondary | ICD-10-CM | POA: Diagnosis not present

## 2016-07-07 DIAGNOSIS — S61451A Open bite of right hand, initial encounter: Secondary | ICD-10-CM | POA: Insufficient documentation

## 2016-07-07 DIAGNOSIS — S51851A Open bite of right forearm, initial encounter: Secondary | ICD-10-CM | POA: Insufficient documentation

## 2016-07-07 DIAGNOSIS — Z90710 Acquired absence of both cervix and uterus: Secondary | ICD-10-CM | POA: Diagnosis not present

## 2016-07-07 DIAGNOSIS — L089 Local infection of the skin and subcutaneous tissue, unspecified: Secondary | ICD-10-CM | POA: Insufficient documentation

## 2016-07-07 DIAGNOSIS — F329 Major depressive disorder, single episode, unspecified: Secondary | ICD-10-CM | POA: Insufficient documentation

## 2016-07-07 DIAGNOSIS — F419 Anxiety disorder, unspecified: Secondary | ICD-10-CM | POA: Insufficient documentation

## 2016-07-07 DIAGNOSIS — E785 Hyperlipidemia, unspecified: Secondary | ICD-10-CM | POA: Diagnosis not present

## 2016-07-07 DIAGNOSIS — W540XXA Bitten by dog, initial encounter: Secondary | ICD-10-CM | POA: Insufficient documentation

## 2016-07-07 DIAGNOSIS — S51801A Unspecified open wound of right forearm, initial encounter: Secondary | ICD-10-CM | POA: Diagnosis not present

## 2016-07-07 DIAGNOSIS — S61401A Unspecified open wound of right hand, initial encounter: Secondary | ICD-10-CM | POA: Diagnosis not present

## 2016-07-07 DIAGNOSIS — S41151A Open bite of right upper arm, initial encounter: Secondary | ICD-10-CM | POA: Diagnosis not present

## 2016-07-07 DIAGNOSIS — J309 Allergic rhinitis, unspecified: Secondary | ICD-10-CM | POA: Diagnosis not present

## 2016-07-07 HISTORY — PX: I & D EXTREMITY: SHX5045

## 2016-07-07 HISTORY — DX: Other specified postprocedural states: R11.2

## 2016-07-07 HISTORY — DX: Anxiety disorder, unspecified: F41.9

## 2016-07-07 HISTORY — DX: Other specified postprocedural states: Z98.890

## 2016-07-07 HISTORY — DX: Major depressive disorder, single episode, unspecified: F32.9

## 2016-07-07 HISTORY — DX: Depression, unspecified: F32.A

## 2016-07-07 SURGERY — IRRIGATION AND DEBRIDEMENT EXTREMITY
Anesthesia: General | Site: Arm Lower | Laterality: Right

## 2016-07-07 MED ORDER — FENTANYL CITRATE (PF) 100 MCG/2ML IJ SOLN
INTRAMUSCULAR | Status: AC
  Filled 2016-07-07: qty 2

## 2016-07-07 MED ORDER — CHLORHEXIDINE GLUCONATE 4 % EX LIQD: 60.0000 mL | Freq: Once | CUTANEOUS | Status: DC

## 2016-07-07 MED ORDER — BUPIVACAINE HCL (PF) 0.25 % IJ SOLN
INTRAMUSCULAR | Status: DC | PRN
  Administered 2016-07-07: 9 mL

## 2016-07-07 MED ORDER — OXYCODONE HCL 5 MG/5ML PO SOLN: 5.0000 mg | Freq: Once | ORAL | Status: DC | PRN

## 2016-07-07 MED ORDER — OXYCODONE-ACETAMINOPHEN 5-325 MG PO TABS: 1.0000 | ORAL_TABLET | ORAL | 0 refills | Status: DC | PRN

## 2016-07-07 MED ORDER — 0.9 % SODIUM CHLORIDE (POUR BTL) OPTIME
TOPICAL | Status: DC | PRN
  Administered 2016-07-07: 1000 mL

## 2016-07-07 MED ORDER — LACTATED RINGERS IV SOLN
INTRAVENOUS | Status: DC
  Administered 2016-07-07: 16:00:00 via INTRAVENOUS

## 2016-07-07 MED ORDER — BUPIVACAINE HCL (PF) 0.25 % IJ SOLN
INTRAMUSCULAR | Status: AC
  Filled 2016-07-07: qty 30

## 2016-07-07 MED ORDER — PROPOFOL 10 MG/ML IV BOLUS
INTRAVENOUS | Status: DC | PRN
  Administered 2016-07-07: 110 mg via INTRAVENOUS

## 2016-07-07 MED ORDER — SODIUM CHLORIDE 0.9 % IR SOLN
Status: DC | PRN
  Administered 2016-07-07: 3000 mL

## 2016-07-07 MED ORDER — OXYCODONE HCL 5 MG PO TABS: 5.0000 mg | ORAL_TABLET | Freq: Once | ORAL | Status: DC | PRN

## 2016-07-07 MED ORDER — FENTANYL CITRATE (PF) 100 MCG/2ML IJ SOLN: 25.0000 ug | INTRAMUSCULAR | Status: DC | PRN

## 2016-07-07 MED ORDER — ONDANSETRON HCL 4 MG/2ML IJ SOLN
INTRAMUSCULAR | Status: DC | PRN
  Administered 2016-07-07: 4 mg via INTRAVENOUS

## 2016-07-07 MED ORDER — SODIUM CHLORIDE 0.9 % IV SOLN
3.0000 g | INTRAVENOUS | Status: AC
  Administered 2016-07-07: 3 g via INTRAVENOUS
  Filled 2016-07-07: qty 3

## 2016-07-07 MED ORDER — LIDOCAINE HCL (CARDIAC) 20 MG/ML IV SOLN
INTRAVENOUS | Status: DC | PRN
  Administered 2016-07-07: 40 mg via INTRAVENOUS

## 2016-07-07 MED ORDER — EPHEDRINE SULFATE 50 MG/ML IJ SOLN
INTRAMUSCULAR | Status: DC | PRN
  Administered 2016-07-07: 10 mg via INTRAVENOUS

## 2016-07-07 MED ORDER — FENTANYL CITRATE (PF) 100 MCG/2ML IJ SOLN
INTRAMUSCULAR | Status: DC | PRN
  Administered 2016-07-07 (×2): 50 ug via INTRAVENOUS

## 2016-07-07 SURGICAL SUPPLY — 56 items
BANDAGE ACE 4X5 VEL STRL LF (GAUZE/BANDAGES/DRESSINGS) ×3 IMPLANT
BANDAGE ELASTIC 3 VELCRO ST LF (GAUZE/BANDAGES/DRESSINGS) ×3 IMPLANT
BANDAGE ELASTIC 4 VELCRO ST LF (GAUZE/BANDAGES/DRESSINGS) ×2 IMPLANT
BNDG CMPR 9X4 STRL LF SNTH (GAUZE/BANDAGES/DRESSINGS) ×1
BNDG ESMARK 4X9 LF (GAUZE/BANDAGES/DRESSINGS) ×2 IMPLANT
BNDG GAUZE ELAST 4 BULKY (GAUZE/BANDAGES/DRESSINGS) ×4 IMPLANT
CORDS BIPOLAR (ELECTRODE) ×2 IMPLANT
COVER SURGICAL LIGHT HANDLE (MISCELLANEOUS) ×6 IMPLANT
CUFF TOURNIQUET SINGLE 18IN (TOURNIQUET CUFF) ×3 IMPLANT
DECANTER SPIKE VIAL GLASS SM (MISCELLANEOUS) ×5 IMPLANT
DRAPE SURG 17X23 STRL (DRAPES) ×3 IMPLANT
DRSG PAD ABDOMINAL 8X10 ST (GAUZE/BANDAGES/DRESSINGS) ×6 IMPLANT
ELECT REM PT RETURN 9FT ADLT (ELECTROSURGICAL)
ELECTRODE REM PT RTRN 9FT ADLT (ELECTROSURGICAL) IMPLANT
GAUZE PACKING IODOFORM 1/4X15 (GAUZE/BANDAGES/DRESSINGS) ×2 IMPLANT
GAUZE SPONGE 4X4 12PLY STRL (GAUZE/BANDAGES/DRESSINGS) ×4 IMPLANT
GAUZE XEROFORM 1X8 LF (GAUZE/BANDAGES/DRESSINGS) ×3 IMPLANT
GLOVE BIO SURGEON STRL SZ7 (GLOVE) ×2 IMPLANT
GLOVE SURG SYN 8.0 (GLOVE) ×6 IMPLANT
GLOVE SURG SYN 8.0 PF PI (GLOVE) ×1 IMPLANT
GOWN STRL REUS W/ TWL LRG LVL3 (GOWN DISPOSABLE) ×1 IMPLANT
GOWN STRL REUS W/ TWL XL LVL3 (GOWN DISPOSABLE) ×1 IMPLANT
GOWN STRL REUS W/TWL LRG LVL3 (GOWN DISPOSABLE) ×3
GOWN STRL REUS W/TWL XL LVL3 (GOWN DISPOSABLE) ×3
HANDPIECE INTERPULSE COAX TIP (DISPOSABLE)
KIT BASIN OR (CUSTOM PROCEDURE TRAY) ×3 IMPLANT
KIT ROOM TURNOVER OR (KITS) ×3 IMPLANT
MANIFOLD NEPTUNE II (INSTRUMENTS) ×3 IMPLANT
NDL HYPO 25GX1X1/2 BEV (NEEDLE) IMPLANT
NDL HYPO 25X1 1.5 SAFETY (NEEDLE) ×1 IMPLANT
NEEDLE HYPO 25GX1X1/2 BEV (NEEDLE) ×3 IMPLANT
NEEDLE HYPO 25X1 1.5 SAFETY (NEEDLE) ×3 IMPLANT
NS IRRIG 1000ML POUR BTL (IV SOLUTION) ×3 IMPLANT
PACK ORTHO EXTREMITY (CUSTOM PROCEDURE TRAY) ×3 IMPLANT
PAD ARMBOARD 7.5X6 YLW CONV (MISCELLANEOUS) ×6 IMPLANT
PAD CAST 4YDX4 CTTN HI CHSV (CAST SUPPLIES) ×2 IMPLANT
PADDING CAST COTTON 4X4 STRL (CAST SUPPLIES) ×6
SET CYSTO W/LG BORE CLAMP LF (SET/KITS/TRAYS/PACK) ×2 IMPLANT
SET HNDPC FAN SPRY TIP SCT (DISPOSABLE) IMPLANT
SPONGE LAP 18X18 X RAY DECT (DISPOSABLE) ×3 IMPLANT
SUCTION FRAZIER HANDLE 10FR (MISCELLANEOUS) ×2
SUCTION TUBE FRAZIER 10FR DISP (MISCELLANEOUS) ×1 IMPLANT
SUT ETHILON 4 0 PS 2 18 (SUTURE) ×2 IMPLANT
SUT VICRYL RAPIDE 4/0 PS 2 (SUTURE) IMPLANT
SWAB COLLECTION DEVICE MRSA (MISCELLANEOUS) ×2 IMPLANT
SWAB CULTURE ESWAB REG 1ML (MISCELLANEOUS) ×2 IMPLANT
SYR 20CC LL (SYRINGE) IMPLANT
SYR CONTROL 10ML LL (SYRINGE) ×3 IMPLANT
TOWEL OR 17X24 6PK STRL BLUE (TOWEL DISPOSABLE) ×3 IMPLANT
TOWEL OR 17X26 10 PK STRL BLUE (TOWEL DISPOSABLE) ×3 IMPLANT
TUBE ANAEROBIC SPECIMEN COL (MISCELLANEOUS) IMPLANT
TUBE CONNECTING 12'X1/4 (SUCTIONS) ×1
TUBE CONNECTING 12X1/4 (SUCTIONS) ×2 IMPLANT
UNDERPAD 30X30 (UNDERPADS AND DIAPERS) ×3 IMPLANT
WATER STERILE IRR 1000ML POUR (IV SOLUTION) ×3 IMPLANT
YANKAUER SUCT BULB TIP NO VENT (SUCTIONS) ×3 IMPLANT

## 2016-07-07 NOTE — Transfer of Care (Signed)
Immediate Anesthesia Transfer of Care Note  Patient: Brindle Borton  Procedure(s) Performed: Procedure(s): IRRIGATION AND DEBRIDEMENT RIGHT HAND AND FOREARM (Right)  Patient Location: PACU  Anesthesia Type:General  Level of Consciousness: awake, alert  and patient cooperative  Airway & Oxygen Therapy: Patient Spontanous Breathing  Post-op Assessment: Report given to RN and Post -op Vital signs reviewed and stable  Post vital signs: Reviewed and stable  Last Vitals:  Vitals:   07/07/16 1502  BP: (!) 139/59  Pulse: 74  Resp: 16  Temp: 36.9 C    Last Pain:  Vitals:   07/07/16 1654  TempSrc:   PainSc: 0-No pain      Patients Stated Pain Goal: 2 ([id number])  Complications: No apparent anesthesia complications

## 2016-07-07 NOTE — Op Note (Signed)
See dictated note [phone]

## 2016-07-07 NOTE — H&P (Signed)
Ashley Mcneil is an 80 y.o. female.   Chief Complaint: Right thumb and wrist pain and swelling. HPI: Patient is a very pleasant 80 year old female status post dog bite to right thumb and wrist approximately 48 hours ago with worsening pain and swelling despite 2 doses of oral antibiotics. Patient was initially seen in her private medical doctors office and referred to [place] for possible tendon or joint involvement status post dog bite  Past Medical History:  Diagnosis Date  . Anxiety   . Depression   . PONV (postoperative nausea and vomiting)     Past Surgical History:  Procedure Laterality Date  . ABDOMINAL HYSTERECTOMY    . TONSILLECTOMY    . WISDOM TOOTH EXTRACTION      Family History  Problem Relation Age of Onset  . Bipolar disorder Mother   . Breast cancer Sister   . Bipolar disorder Sister    Social History:  reports that she has never smoked. She has never used smokeless tobacco. Her alcohol and drug histories are not on file.  Allergies:  Allergies  Allergen Reactions  . Penicillins     No prescriptions prior to admission.    No results found for this or any previous visit (from the past 48 hour(s)). No results found.  Review of Systems  All other systems reviewed and are negative.   Blood pressure (!) 139/59, pulse 74, temperature 98.4 F (36.9 C), temperature source Oral, resp. rate 16, height 5\' 3"  (1.6 m), weight 50.3 kg (111 lb), SpO2 100 %. Physical Exam  Constitutional: She is oriented to person, place, and time. She appears well-developed and well-nourished.  HENT:  Head: Normocephalic and atraumatic.  Neck: Normal range of motion.  Cardiovascular: Normal rate.   Respiratory: Effort normal.  Musculoskeletal:       Right hand: She exhibits tenderness, laceration and swelling.  Status post dog bite to right thumb over metacarpal and extensor tendons with surrounding erythema and tenderness.  Neurological: She is alert and oriented to person,  place, and time.  Skin: Skin is warm. There is erythema.  Psychiatric: She has a normal mood and affect. Her behavior is normal. Judgment and thought content normal.     Assessment/Plan As above. Plan incision and drainage with culturing of right hand, thumb, and extensor sheath, we'll be able to discharge home with follow-up in my office tomorrow for local wound care. Patient will continue current oral antibiotics.  Schuyler Amor, MD 07/07/2016, 4:04 PM

## 2016-07-07 NOTE — Anesthesia Procedure Notes (Signed)
Procedure Name: LMA Insertion Date/Time: 07/07/2016 4:20 PM Performed by: Lance Coon Pre-anesthesia Checklist: Patient identified, Emergency Drugs available, Patient being monitored, Suction available and Timeout performed Patient Re-evaluated:Patient Re-evaluated prior to inductionOxygen Delivery Method: Circle system utilized Preoxygenation: Pre-oxygenation with 100% oxygen Intubation Type: IV induction LMA: LMA inserted LMA Size: 3.0 Number of attempts: 1 Placement Confirmation: breath sounds checked- equal and bilateral Tube secured with: Tape Dental Injury: Teeth and Oropharynx as per pre-operative assessment

## 2016-07-07 NOTE — Anesthesia Preprocedure Evaluation (Signed)
Anesthesia Evaluation  Patient identified by MRN, date of birth, ID band Patient awake    Reviewed: Allergy & Precautions, NPO status , Patient's Chart, lab work & pertinent test results  History of Anesthesia Complications (+) PONV and history of anesthetic complications  Airway Mallampati: I  TM Distance: >3 FB Neck ROM: Full    Dental  (+) Teeth Intact   Pulmonary neg pulmonary ROS,    breath sounds clear to auscultation       Cardiovascular negative cardio ROS   Rhythm:Regular     Neuro/Psych PSYCHIATRIC DISORDERS Anxiety Depression negative neurological ROS     GI/Hepatic negative GI ROS, Neg liver ROS,   Endo/Other  negative endocrine ROS  Renal/GU negative Renal ROS     Musculoskeletal   Abdominal   Peds  Hematology negative hematology ROS (+)   Anesthesia Other Findings   Reproductive/Obstetrics                             Anesthesia Physical Anesthesia Plan  ASA: II  Anesthesia Plan: General   Post-op Pain Management:    Induction: Intravenous  Airway Management Planned: LMA  Additional Equipment: None  Intra-op Plan:   Post-operative Plan: Extubation in OR  Informed Consent: I have reviewed the patients History and Physical, chart, labs and discussed the procedure including the risks, benefits and alternatives for the proposed anesthesia with the patient or authorized representative who has indicated his/her understanding and acceptance.   Dental advisory given  Plan Discussed with: CRNA and Surgeon  Anesthesia Plan Comments:         Anesthesia Quick Evaluation

## 2016-07-08 ENCOUNTER — Encounter (HOSPITAL_COMMUNITY): Payer: Self-pay | Admitting: Orthopedic Surgery

## 2016-07-08 DIAGNOSIS — S61451D Open bite of right hand, subsequent encounter: Secondary | ICD-10-CM | POA: Diagnosis not present

## 2016-07-08 DIAGNOSIS — S41151A Open bite of right upper arm, initial encounter: Secondary | ICD-10-CM | POA: Diagnosis not present

## 2016-07-08 DIAGNOSIS — W540XXA Bitten by dog, initial encounter: Secondary | ICD-10-CM | POA: Diagnosis not present

## 2016-07-08 DIAGNOSIS — M7989 Other specified soft tissue disorders: Secondary | ICD-10-CM | POA: Diagnosis not present

## 2016-07-08 DIAGNOSIS — M79641 Pain in right hand: Secondary | ICD-10-CM | POA: Diagnosis not present

## 2016-07-08 NOTE — Op Note (Signed)
Ashley Mcneil, Ashley Mcneil                ACCOUNT NO.:  [id number]  MEDICAL RECORD NO.:  [id number]  LOCATION:  [facility]                         FACILITY:  [facility]  PHYSICIAN:  Sheral Apley. [name], M.D.DATE OF BIRTH:  1935/08/03  DATE OF PROCEDURE:  07/07/2016 DATE OF DISCHARGE:                              OPERATIVE REPORT   PREOPERATIVE DIAGNOSIS:  Infected dog bite, right forearm and right hand.  POSTOPERATIVE DIAGNOSIS:  Infected dog bite, right forearm and right hand.  PROCEDURE:  Incision and drainage of right forearm and right hand thenar webspace between thumb and index finger.  SURGEON:  Sheral Apley. Burney Gauze, M.D.  ASSISTANT:  None.  ANESTHESIA:  General.  COMPLICATIONS:  No complication.  DRAINS:  No drains.  Wound packed open.  A culture was sent.  DESCRIPTION OF PROCEDURE:  The patient was taken to the operating suite. After induction of adequate general anesthetic, the right upper extremity was prepped and draped in sterile fashion.  An Esmarch was used to exsanguinate the limb.  Tourniquet was then inflated to 250 mmHg.  At this point in time, an incision was made over the distal 3rd of the forearm dorsally ulnarly over the dog bite wound.  The skin was incised 1 cm.  Blunt dissection was carried down to the fascia.  There was some cloudy fluid, but no purulent material was encountered.  A second incision was made over the right thumb and index web, paralleling the EPL tendon sheath.  Skin was incised sharply 3-4 cm.  Purulence was encountered and cultured.  We bluntly dissected down to include the extensor sheath of the EPL and the first dorsal interosseous muscle.  We then thoroughly irrigated both of these incisions with 3 L of normal saline using cystoscopy tubing.  We primarily closed the proximal wound and then packed open the distal wound with a 0.25-inch iodoform gauze. We then dressed with 4x4s, fluffs, and a compression wrap.  The patient tolerated these  procedures well and went recovery room in stable fashion.     Sheral Apley Burney Gauze, M.D.    MAW/MEDQ  D:  07/07/2016  T:  07/08/2016  Job:  [id number]

## 2016-07-08 NOTE — Anesthesia Postprocedure Evaluation (Signed)
Anesthesia Post Note  Patient: Ashley Mcneil  Procedure(s) Performed: Procedure(s) (LRB): IRRIGATION AND DEBRIDEMENT RIGHT HAND AND FOREARM (Right)  Patient location during evaluation: PACU Anesthesia Type: General Level of consciousness: awake Pain management: pain level controlled Vital Signs Assessment: post-procedure vital signs reviewed and stable Respiratory status: spontaneous breathing, respiratory function stable and patient connected to nasal cannula oxygen Cardiovascular status: stable Postop Assessment: no signs of nausea or vomiting Anesthetic complications: no       Last Vitals:  Vitals:   07/07/16 1715 07/07/16 1724  BP:  (!) 145/68  Pulse: 87 84  Resp: 20 (!) 21  Temp:  37 C    Last Pain:  Vitals:   07/07/16 1724  TempSrc:   PainSc: 0-No pain                 Breanne Olvera

## 2016-07-09 DIAGNOSIS — M79641 Pain in right hand: Secondary | ICD-10-CM | POA: Diagnosis not present

## 2016-07-09 DIAGNOSIS — W540XXA Bitten by dog, initial encounter: Secondary | ICD-10-CM | POA: Diagnosis not present

## 2016-07-09 DIAGNOSIS — S61451D Open bite of right hand, subsequent encounter: Secondary | ICD-10-CM | POA: Diagnosis not present

## 2016-07-09 DIAGNOSIS — S41151A Open bite of right upper arm, initial encounter: Secondary | ICD-10-CM | POA: Diagnosis not present

## 2016-07-09 DIAGNOSIS — M7989 Other specified soft tissue disorders: Secondary | ICD-10-CM | POA: Diagnosis not present

## 2016-07-10 LAB — AEROBIC CULTURE W GRAM STAIN (SUPERFICIAL SPECIMEN): Culture: NO GROWTH

## 2016-07-12 LAB — ANAEROBIC CULTURE

## 2016-07-13 DIAGNOSIS — M79641 Pain in right hand: Secondary | ICD-10-CM | POA: Diagnosis not present

## 2016-07-13 DIAGNOSIS — S61451D Open bite of right hand, subsequent encounter: Secondary | ICD-10-CM | POA: Diagnosis not present

## 2016-07-13 DIAGNOSIS — M7989 Other specified soft tissue disorders: Secondary | ICD-10-CM | POA: Diagnosis not present

## 2016-07-13 DIAGNOSIS — W540XXA Bitten by dog, initial encounter: Secondary | ICD-10-CM | POA: Diagnosis not present

## 2016-07-13 DIAGNOSIS — S41151A Open bite of right upper arm, initial encounter: Secondary | ICD-10-CM | POA: Diagnosis not present

## 2016-07-15 DIAGNOSIS — M79641 Pain in right hand: Secondary | ICD-10-CM | POA: Diagnosis not present

## 2016-07-16 DIAGNOSIS — M79641 Pain in right hand: Secondary | ICD-10-CM | POA: Diagnosis not present

## 2016-07-20 DIAGNOSIS — W540XXA Bitten by dog, initial encounter: Secondary | ICD-10-CM | POA: Diagnosis not present

## 2016-07-20 DIAGNOSIS — M7989 Other specified soft tissue disorders: Secondary | ICD-10-CM | POA: Diagnosis not present

## 2016-07-20 DIAGNOSIS — S41151A Open bite of right upper arm, initial encounter: Secondary | ICD-10-CM | POA: Diagnosis not present

## 2016-07-20 DIAGNOSIS — M79641 Pain in right hand: Secondary | ICD-10-CM | POA: Diagnosis not present

## 2016-07-20 DIAGNOSIS — S61451D Open bite of right hand, subsequent encounter: Secondary | ICD-10-CM | POA: Diagnosis not present

## 2016-07-21 DIAGNOSIS — S61451D Open bite of right hand, subsequent encounter: Secondary | ICD-10-CM | POA: Diagnosis not present

## 2016-07-21 DIAGNOSIS — S41151A Open bite of right upper arm, initial encounter: Secondary | ICD-10-CM | POA: Diagnosis not present

## 2016-07-21 DIAGNOSIS — W540XXA Bitten by dog, initial encounter: Secondary | ICD-10-CM | POA: Diagnosis not present

## 2016-07-21 DIAGNOSIS — M79641 Pain in right hand: Secondary | ICD-10-CM | POA: Diagnosis not present

## 2016-07-22 DIAGNOSIS — M79641 Pain in right hand: Secondary | ICD-10-CM | POA: Diagnosis not present

## 2016-07-22 DIAGNOSIS — W540XXA Bitten by dog, initial encounter: Secondary | ICD-10-CM | POA: Diagnosis not present

## 2016-07-22 DIAGNOSIS — S61401D Unspecified open wound of right hand, subsequent encounter: Secondary | ICD-10-CM | POA: Diagnosis not present

## 2016-07-22 DIAGNOSIS — S61451D Open bite of right hand, subsequent encounter: Secondary | ICD-10-CM | POA: Diagnosis not present

## 2016-07-22 DIAGNOSIS — S41151A Open bite of right upper arm, initial encounter: Secondary | ICD-10-CM | POA: Diagnosis not present

## 2016-07-26 DIAGNOSIS — W540XXA Bitten by dog, initial encounter: Secondary | ICD-10-CM | POA: Diagnosis not present

## 2016-07-26 DIAGNOSIS — S61451D Open bite of right hand, subsequent encounter: Secondary | ICD-10-CM | POA: Diagnosis not present

## 2016-07-26 DIAGNOSIS — S41151A Open bite of right upper arm, initial encounter: Secondary | ICD-10-CM | POA: Diagnosis not present

## 2016-07-26 DIAGNOSIS — M79641 Pain in right hand: Secondary | ICD-10-CM | POA: Diagnosis not present

## 2016-07-26 DIAGNOSIS — S61401D Unspecified open wound of right hand, subsequent encounter: Secondary | ICD-10-CM | POA: Diagnosis not present

## 2016-07-27 DIAGNOSIS — H905 Unspecified sensorineural hearing loss: Secondary | ICD-10-CM | POA: Diagnosis not present

## 2016-07-28 DIAGNOSIS — M5136 Other intervertebral disc degeneration, lumbar region: Secondary | ICD-10-CM | POA: Diagnosis not present

## 2016-07-28 DIAGNOSIS — M419 Scoliosis, unspecified: Secondary | ICD-10-CM | POA: Diagnosis not present

## 2016-07-28 DIAGNOSIS — M545 Low back pain: Secondary | ICD-10-CM | POA: Diagnosis not present

## 2016-07-28 DIAGNOSIS — M7061 Trochanteric bursitis, right hip: Secondary | ICD-10-CM | POA: Diagnosis not present

## 2016-08-05 DIAGNOSIS — M545 Low back pain: Secondary | ICD-10-CM | POA: Diagnosis not present

## 2016-08-05 DIAGNOSIS — M5136 Other intervertebral disc degeneration, lumbar region: Secondary | ICD-10-CM | POA: Diagnosis not present

## 2016-08-05 DIAGNOSIS — M7061 Trochanteric bursitis, right hip: Secondary | ICD-10-CM | POA: Diagnosis not present

## 2016-08-05 DIAGNOSIS — M419 Scoliosis, unspecified: Secondary | ICD-10-CM | POA: Diagnosis not present

## 2016-08-09 DIAGNOSIS — L821 Other seborrheic keratosis: Secondary | ICD-10-CM | POA: Diagnosis not present

## 2016-08-09 DIAGNOSIS — L814 Other melanin hyperpigmentation: Secondary | ICD-10-CM | POA: Diagnosis not present

## 2016-08-09 DIAGNOSIS — L409 Psoriasis, unspecified: Secondary | ICD-10-CM | POA: Diagnosis not present

## 2016-08-09 DIAGNOSIS — L299 Pruritus, unspecified: Secondary | ICD-10-CM | POA: Diagnosis not present

## 2016-08-18 DIAGNOSIS — M5136 Other intervertebral disc degeneration, lumbar region: Secondary | ICD-10-CM | POA: Diagnosis not present

## 2016-08-18 DIAGNOSIS — M419 Scoliosis, unspecified: Secondary | ICD-10-CM | POA: Diagnosis not present

## 2016-08-18 DIAGNOSIS — M7061 Trochanteric bursitis, right hip: Secondary | ICD-10-CM | POA: Diagnosis not present

## 2016-08-18 DIAGNOSIS — M545 Low back pain: Secondary | ICD-10-CM | POA: Diagnosis not present

## 2016-08-30 DIAGNOSIS — L720 Epidermal cyst: Secondary | ICD-10-CM | POA: Diagnosis not present

## 2016-08-30 DIAGNOSIS — D692 Other nonthrombocytopenic purpura: Secondary | ICD-10-CM | POA: Diagnosis not present

## 2016-08-30 DIAGNOSIS — L821 Other seborrheic keratosis: Secondary | ICD-10-CM | POA: Diagnosis not present

## 2016-08-30 DIAGNOSIS — R197 Diarrhea, unspecified: Secondary | ICD-10-CM | POA: Diagnosis not present

## 2016-08-30 DIAGNOSIS — D1801 Hemangioma of skin and subcutaneous tissue: Secondary | ICD-10-CM | POA: Diagnosis not present

## 2016-08-30 DIAGNOSIS — L814 Other melanin hyperpigmentation: Secondary | ICD-10-CM | POA: Diagnosis not present

## 2016-08-30 DIAGNOSIS — L400 Psoriasis vulgaris: Secondary | ICD-10-CM | POA: Diagnosis not present

## 2016-09-02 DIAGNOSIS — R8290 Unspecified abnormal findings in urine: Secondary | ICD-10-CM | POA: Diagnosis not present

## 2016-09-02 DIAGNOSIS — N3010 Interstitial cystitis (chronic) without hematuria: Secondary | ICD-10-CM | POA: Diagnosis not present

## 2016-09-02 DIAGNOSIS — R3129 Other microscopic hematuria: Secondary | ICD-10-CM | POA: Diagnosis not present

## 2016-09-08 DIAGNOSIS — E784 Other hyperlipidemia: Secondary | ICD-10-CM | POA: Diagnosis not present

## 2016-09-08 DIAGNOSIS — M859 Disorder of bone density and structure, unspecified: Secondary | ICD-10-CM | POA: Diagnosis not present

## 2016-09-08 DIAGNOSIS — N183 Chronic kidney disease, stage 3 (moderate): Secondary | ICD-10-CM | POA: Diagnosis not present

## 2016-09-08 DIAGNOSIS — Z0000 Encounter for general adult medical examination without abnormal findings: Secondary | ICD-10-CM | POA: Diagnosis not present

## 2016-09-13 DIAGNOSIS — L408 Other psoriasis: Secondary | ICD-10-CM | POA: Diagnosis not present

## 2016-09-13 DIAGNOSIS — Z681 Body mass index (BMI) 19 or less, adult: Secondary | ICD-10-CM | POA: Diagnosis not present

## 2016-09-13 DIAGNOSIS — Z0000 Encounter for general adult medical examination without abnormal findings: Secondary | ICD-10-CM | POA: Diagnosis not present

## 2016-09-13 DIAGNOSIS — E784 Other hyperlipidemia: Secondary | ICD-10-CM | POA: Diagnosis not present

## 2016-09-13 DIAGNOSIS — R634 Abnormal weight loss: Secondary | ICD-10-CM | POA: Diagnosis not present

## 2016-09-13 DIAGNOSIS — R2681 Unsteadiness on feet: Secondary | ICD-10-CM | POA: Diagnosis not present

## 2016-09-13 DIAGNOSIS — F418 Other specified anxiety disorders: Secondary | ICD-10-CM | POA: Diagnosis not present

## 2016-09-13 DIAGNOSIS — Z1389 Encounter for screening for other disorder: Secondary | ICD-10-CM | POA: Diagnosis not present

## 2016-09-13 DIAGNOSIS — M859 Disorder of bone density and structure, unspecified: Secondary | ICD-10-CM | POA: Diagnosis not present

## 2016-10-28 ENCOUNTER — Ambulatory Visit (INDEPENDENT_AMBULATORY_CARE_PROVIDER_SITE_OTHER): Payer: Medicare Other | Admitting: Internal Medicine

## 2016-10-28 VITALS — BP 122/72 | HR 65 | Temp 98.2°F | Ht 62.0 in | Wt 108.0 lb

## 2016-10-28 DIAGNOSIS — A0471 Enterocolitis due to Clostridium difficile, recurrent: Secondary | ICD-10-CM | POA: Diagnosis present

## 2016-10-28 DIAGNOSIS — R634 Abnormal weight loss: Secondary | ICD-10-CM | POA: Diagnosis not present

## 2016-10-28 NOTE — Patient Instructions (Signed)
If you have 4+ watery stools please drop off stool specimen to test for cdifficile to see if need to retreat

## 2016-10-28 NOTE — Progress Notes (Signed)
RFV: new patient for recurrent cdifficile, referred by Dr Valetta Parillo.  Patient ID: Ashley Mcneil, female   DOB: 1935-09-03, 80 y.o.   MRN: [id number]  HPI  Ashley Mcneil is a 81yo F with past med history of depression, hematuria, interstitial cystitis, HLD who had dog bite to right had on 12/17 required I x D by Dr Burney Gauze on Dec 20th. At that time had 2 courses of oral abtx.   In mid january she started to notice have watery diarrhea and was treated with oral vancomycin for 10 days. Showed some improved in bowel movement. Dropped weight down to 102 # from 111 (in December)  Did ok for 10-14 days and then noticed recurrence, with watery diarrhea with abdominal cramping, no blood in stool. PCP repeated cdiff testing and treatment.Still had decreased appetite, weight loss, depressed, and feeling dehydrated.   2/13 : cdif pcr +. Was treated oral vancomycin '250mg'$  QID x 10 days finished  on 2/26. She has been doing well in terms of her bowel movements since stopping her last course of abtx. Since then has had some weight gain, today now back at 108, and improved appetite   Possibly in 2/21 it appears she had ua urine cx showing proteus and kleb oxytoca- but thought to be colonization  Had 2 bowel movements a day but has urgency  Allergies  Allergen Reactions  . Penicillins     Per patient - unknown reaction 60 yrs ago    Outpatient Encounter Prescriptions as of 10/28/2016  Medication Sig  . Apremilast (OTEZLA) 30 MG TABS Take 1 tablet by mouth 2 (two) times daily.  . Calcium Carb-Cholecalciferol (CALCIUM-VITAMIN D) 600-400 MG-UNIT TABS Take 1 tablet by mouth daily.  Marland Kitchen LORazepam (ATIVAN) 0.5 MG tablet Take 0.5 mg by mouth every 8 (eight) hours. 2 in am 3 at pm  . lovastatin (MEVACOR) 40 MG tablet Take 40 mg by mouth at bedtime.  . Probiotic Product (ALIGN PO) Take 1 capsule by mouth 2 (two) times daily.  . sertraline (ZOLOFT) 100 MG tablet Take 100 mg by mouth daily.  . [DISCONTINUED]  oxyCODONE-acetaminophen (ROXICET) 5-325 MG tablet Take 1 tablet by mouth every 4 (four) hours as needed for severe pain.   No facility-administered encounter medications on file as of 10/28/2016.      Patient Active Problem List   Diagnosis Date Noted  . HYPERLIPIDEMIA 12/30/2008  . EUSTACHIAN TUBE DYSFUNCTION 11/23/2007  . POSTNASAL DRIP SYNDROME 11/22/2007  . ALLERGIC RHINITIS 11/22/2007     Health Maintenance Due  Topic Date Due  . TETANUS/TDAP  02/18/1957  . DEXA SCAN  02/19/2003  . PNA vac Low Risk Adult (1 of 2 - PCV13) 02/19/2003    Social History  Substance Use Topics  . Smoking status: Never Smoker  . Smokeless tobacco: Never Used  . Alcohol use Not on file     Comment: 1 glass per night  family history includes Bipolar disorder in her mother and sister; Breast cancer in her sister.  Review of Systems  Constitutional: Negative for fever, chills, diaphoresis, activity change, appetite change, fatigue and unexpected weight change.  HENT: Negative for congestion, sore throat, rhinorrhea, sneezing, trouble swallowing and sinus pressure.  Eyes: Negative for photophobia and visual disturbance.  Respiratory: Negative for cough, chest tightness, shortness of breath, wheezing and stridor.  Cardiovascular: Negative for chest pain, palpitations and leg swelling.  Gastrointestinal: Negative for nausea, vomiting, abdominal pain, diarrhea, constipation, blood in stool, abdominal distention and anal bleeding.  Genitourinary: Negative for dysuria, hematuria, flank pain and difficulty urinating.  Musculoskeletal: Negative for myalgias, back pain, joint swelling, arthralgias and gait problem.  Skin: Negative for color change, pallor, rash and wound.  Neurological: Negative for dizziness, tremors, weakness and light-headedness.  Hematological: Negative for adenopathy. Does not bruise/bleed easily.  Psychiatric/Behavioral: Negative for behavioral problems, confusion, sleep disturbance,  dysphoric mood, decreased concentration and agitation.    Physical Exam   BP 122/72   Pulse 65   Temp 98.2 F (36.8 C) (Oral)   Ht '5\' 2"'$  (1.575 m)   Wt 108 lb (49 kg)   BMI 19.75 kg/m   Physical Exam  Constitutional:  oriented to person, place, and time. appears well-developed and well-nourished. No distress.  HENT: [place]/AT, PERRLA, no scleral icterus Mouth/Throat: Oropharynx is clear and moist. No oropharyngeal exudate.  Abdominal: Soft. Bowel sounds are normal.  exhibits no distension. There is no tenderness.  Neurological: alert and oriented to person, place, and time.  Skin: Skin is warm and dry. No rash noted. No erythema.  Psychiatric: a normal mood and affect.  behavior is normal.     Assessment and Plan  recurrent c.difficile = thus far symptom free for roughly 4 weeks,  She hopes she doesn't have relapse. We will give her stool kit in event she has relapse. If she has relapse  I would either do: vanco taper -  Vs. fidaxomicin course (I would attempt to see what oop cost would be for fidaxomicin first )  Weight loss = getting closer to her baseline weight at least to last Fall. No need for appetite stimulant  rtc for follow up in 6-8wk  Spent 40 min with patient discussing treatment plan for recurrent cdifficile

## 2016-11-08 DIAGNOSIS — J328 Other chronic sinusitis: Secondary | ICD-10-CM | POA: Diagnosis not present

## 2016-11-08 DIAGNOSIS — B9689 Other specified bacterial agents as the cause of diseases classified elsewhere: Secondary | ICD-10-CM | POA: Diagnosis not present

## 2016-11-08 DIAGNOSIS — F418 Other specified anxiety disorders: Secondary | ICD-10-CM | POA: Diagnosis not present

## 2016-11-08 DIAGNOSIS — Z681 Body mass index (BMI) 19 or less, adult: Secondary | ICD-10-CM | POA: Diagnosis not present

## 2016-11-08 DIAGNOSIS — R2681 Unsteadiness on feet: Secondary | ICD-10-CM | POA: Diagnosis not present

## 2016-11-15 ENCOUNTER — Ambulatory Visit: Payer: Medicare Other | Admitting: Internal Medicine

## 2016-11-29 DIAGNOSIS — F331 Major depressive disorder, recurrent, moderate: Secondary | ICD-10-CM | POA: Diagnosis not present

## 2016-12-20 ENCOUNTER — Telehealth: Payer: Self-pay | Admitting: *Deleted

## 2016-12-20 NOTE — Telephone Encounter (Signed)
Patient called stating she has been taking align twice daily and wants to know that since she has not had any issues with c-diff in several months; can she start taking it once daily. Myrtis Hopping

## 2016-12-20 NOTE — Telephone Encounter (Signed)
Per Dr. Baxter Flattery, patient notified that she can start taking the align once daily. Myrtis Hopping

## 2016-12-28 ENCOUNTER — Ambulatory Visit: Payer: Medicare Other | Admitting: Internal Medicine

## 2017-01-20 ENCOUNTER — Ambulatory Visit (INDEPENDENT_AMBULATORY_CARE_PROVIDER_SITE_OTHER): Payer: Medicare Other | Admitting: Internal Medicine

## 2017-01-20 VITALS — BP 109/66 | HR 83 | Temp 98.0°F | Wt 116.0 lb

## 2017-01-20 DIAGNOSIS — A0471 Enterocolitis due to Clostridium difficile, recurrent: Secondary | ICD-10-CM

## 2017-01-20 DIAGNOSIS — R634 Abnormal weight loss: Secondary | ICD-10-CM

## 2017-01-20 NOTE — Progress Notes (Signed)
   RFV: follow for recurrent cdifficile  Patient ID: Ashley Mcneil, female   DOB: 1936-02-06, 81 y.o.   MRN: [id number]  HPI 81yo F with history of recurrent cdifficile. Has not had recurrence since march 2018. Currently on aline pill once a day  Started to gain back her baseline weight. Has 2 -3 loose BM per day on occasion but not watery or with abdominal cramping.  No recent exposure to abtx  Outpatient Encounter Prescriptions as of 01/20/2017  Medication Sig  . Apremilast (OTEZLA) 30 MG TABS Take 1 tablet by mouth 2 (two) times daily.  . Calcium Carb-Cholecalciferol (CALCIUM-VITAMIN D) 600-400 MG-UNIT TABS Take 1 tablet by mouth daily.  . clobetasol (TEMOVATE) 0.05 % external solution APPLY TWICE DAILY  . LORazepam (ATIVAN) 0.5 MG tablet Take 0.5 mg by mouth every 8 (eight) hours. 2 in am 3 at pm  . lovastatin (MEVACOR) 40 MG tablet Take 40 mg by mouth at bedtime.  . Probiotic Product (ALIGN PO) Take 1 capsule by mouth 2 (two) times daily.  . sertraline (ZOLOFT) 100 MG tablet Take 100 mg by mouth daily.  Marland Kitchen alendronate (FOSAMAX) 70 MG tablet Take 70 mg by mouth.  . divalproex (DEPAKOTE SPRINKLE) 125 MG capsule    No facility-administered encounter medications on file as of 01/20/2017.      Patient Active Problem List   Diagnosis Date Noted  . HYPERLIPIDEMIA 12/30/2008  . EUSTACHIAN TUBE DYSFUNCTION 11/23/2007  . POSTNASAL DRIP SYNDROME 11/22/2007  . ALLERGIC RHINITIS 11/22/2007     Health Maintenance Due  Topic Date Due  . TETANUS/TDAP  02/18/1957  . DEXA SCAN  02/19/2003  . PNA vac Low Risk Adult (1 of 2 - PCV13) 02/19/2003     Review of Systems  Constitutional: Negative for fever, chills, diaphoresis, activity change, appetite change, fatigue and unexpected weight change.  HENT: Negative for congestion, sore throat, rhinorrhea, sneezing, trouble swallowing and sinus pressure.  Eyes: Negative for photophobia and visual disturbance.  Respiratory: Negative for  cough, chest tightness, shortness of breath, wheezing and stridor.  Cardiovascular: Negative for chest pain, palpitations and leg swelling.  Gastrointestinal: Negative for nausea, vomiting, abdominal pain, diarrhea, constipation, blood in stool, abdominal distention and anal bleeding.  Genitourinary: Negative for dysuria, hematuria, flank pain and difficulty urinating.  Musculoskeletal: Negative for myalgias, back pain, joint swelling, arthralgias and gait problem.  Skin: Negative for color change, pallor, rash and wound.  Neurological: Negative for dizziness, tremors, weakness and light-headedness.  Hematological: Negative for adenopathy. Does not bruise/bleed easily.  Psychiatric/Behavioral: Negative for behavioral problems, confusion, sleep disturbance, dysphoric mood, decreased concentration and agitation.    Physical Exam   BP 109/66   Pulse 83   Temp 98 F (36.7 C)   Wt 116 lb (52.6 kg)   SpO2 96%   BMI 21.22 kg/m    Physical Exam  Constitutional:  oriented to person, place, and time. appears well-developed and well-nourished. No distress.  HENT: [place]/AT, PERRLA, no scleral icterus Mouth/Throat: Oropharynx is clear and moist. No oropharyngeal exudate.  Skin: Skin is warm and dry. No rash noted. No erythema.    Assessment and Plan   Recurrent cdifficile = appears to be in remission for now. No relapse. Avoid unnecessary abtx. Return as needed.

## 2017-02-07 DIAGNOSIS — L409 Psoriasis, unspecified: Secondary | ICD-10-CM | POA: Diagnosis not present

## 2017-02-07 DIAGNOSIS — L821 Other seborrheic keratosis: Secondary | ICD-10-CM | POA: Diagnosis not present

## 2017-03-23 DIAGNOSIS — H608X3 Other otitis externa, bilateral: Secondary | ICD-10-CM | POA: Diagnosis not present

## 2017-03-23 DIAGNOSIS — H903 Sensorineural hearing loss, bilateral: Secondary | ICD-10-CM | POA: Diagnosis not present

## 2017-04-13 DIAGNOSIS — L738 Other specified follicular disorders: Secondary | ICD-10-CM | POA: Diagnosis not present

## 2017-04-13 DIAGNOSIS — L570 Actinic keratosis: Secondary | ICD-10-CM | POA: Diagnosis not present

## 2017-04-13 DIAGNOSIS — L0293 Carbuncle, unspecified: Secondary | ICD-10-CM | POA: Diagnosis not present

## 2017-04-13 DIAGNOSIS — L720 Epidermal cyst: Secondary | ICD-10-CM | POA: Diagnosis not present

## 2017-04-13 DIAGNOSIS — L0889 Other specified local infections of the skin and subcutaneous tissue: Secondary | ICD-10-CM | POA: Diagnosis not present

## 2017-04-13 DIAGNOSIS — L400 Psoriasis vulgaris: Secondary | ICD-10-CM | POA: Diagnosis not present

## 2017-04-13 DIAGNOSIS — L821 Other seborrheic keratosis: Secondary | ICD-10-CM | POA: Diagnosis not present

## 2017-05-16 DIAGNOSIS — M5136 Other intervertebral disc degeneration, lumbar region: Secondary | ICD-10-CM | POA: Diagnosis not present

## 2017-05-16 DIAGNOSIS — M7061 Trochanteric bursitis, right hip: Secondary | ICD-10-CM | POA: Diagnosis not present

## 2017-05-16 DIAGNOSIS — M419 Scoliosis, unspecified: Secondary | ICD-10-CM | POA: Diagnosis not present

## 2017-05-16 DIAGNOSIS — M545 Low back pain: Secondary | ICD-10-CM | POA: Diagnosis not present

## 2017-06-01 DIAGNOSIS — M419 Scoliosis, unspecified: Secondary | ICD-10-CM | POA: Diagnosis not present

## 2017-06-01 DIAGNOSIS — M7061 Trochanteric bursitis, right hip: Secondary | ICD-10-CM | POA: Diagnosis not present

## 2017-06-01 DIAGNOSIS — M545 Low back pain: Secondary | ICD-10-CM | POA: Diagnosis not present

## 2017-06-01 DIAGNOSIS — M5136 Other intervertebral disc degeneration, lumbar region: Secondary | ICD-10-CM | POA: Diagnosis not present

## 2017-06-06 DIAGNOSIS — L0201 Cutaneous abscess of face: Secondary | ICD-10-CM | POA: Diagnosis not present

## 2017-06-06 DIAGNOSIS — L723 Sebaceous cyst: Secondary | ICD-10-CM | POA: Diagnosis not present

## 2017-06-06 DIAGNOSIS — L738 Other specified follicular disorders: Secondary | ICD-10-CM | POA: Diagnosis not present

## 2017-06-06 DIAGNOSIS — L720 Epidermal cyst: Secondary | ICD-10-CM | POA: Diagnosis not present

## 2017-06-13 DIAGNOSIS — H2513 Age-related nuclear cataract, bilateral: Secondary | ICD-10-CM | POA: Diagnosis not present

## 2017-06-13 DIAGNOSIS — H52203 Unspecified astigmatism, bilateral: Secondary | ICD-10-CM | POA: Diagnosis not present

## 2017-06-15 DIAGNOSIS — Z23 Encounter for immunization: Secondary | ICD-10-CM | POA: Diagnosis not present

## 2017-06-22 DIAGNOSIS — M545 Low back pain: Secondary | ICD-10-CM | POA: Diagnosis not present

## 2017-06-22 DIAGNOSIS — M5136 Other intervertebral disc degeneration, lumbar region: Secondary | ICD-10-CM | POA: Diagnosis not present

## 2017-06-22 DIAGNOSIS — M419 Scoliosis, unspecified: Secondary | ICD-10-CM | POA: Diagnosis not present

## 2017-06-22 DIAGNOSIS — M7061 Trochanteric bursitis, right hip: Secondary | ICD-10-CM | POA: Diagnosis not present

## 2017-07-06 DIAGNOSIS — M419 Scoliosis, unspecified: Secondary | ICD-10-CM | POA: Diagnosis not present

## 2017-07-06 DIAGNOSIS — M545 Low back pain: Secondary | ICD-10-CM | POA: Diagnosis not present

## 2017-07-06 DIAGNOSIS — M5136 Other intervertebral disc degeneration, lumbar region: Secondary | ICD-10-CM | POA: Diagnosis not present

## 2017-07-06 DIAGNOSIS — M7061 Trochanteric bursitis, right hip: Secondary | ICD-10-CM | POA: Diagnosis not present

## 2017-07-25 DIAGNOSIS — M419 Scoliosis, unspecified: Secondary | ICD-10-CM | POA: Diagnosis not present

## 2017-07-25 DIAGNOSIS — M5136 Other intervertebral disc degeneration, lumbar region: Secondary | ICD-10-CM | POA: Diagnosis not present

## 2017-07-25 DIAGNOSIS — M7061 Trochanteric bursitis, right hip: Secondary | ICD-10-CM | POA: Diagnosis not present

## 2017-07-25 DIAGNOSIS — M545 Low back pain: Secondary | ICD-10-CM | POA: Diagnosis not present

## 2017-07-28 DIAGNOSIS — F331 Major depressive disorder, recurrent, moderate: Secondary | ICD-10-CM | POA: Diagnosis not present

## 2017-08-01 DIAGNOSIS — M419 Scoliosis, unspecified: Secondary | ICD-10-CM | POA: Diagnosis not present

## 2017-08-01 DIAGNOSIS — M545 Low back pain: Secondary | ICD-10-CM | POA: Diagnosis not present

## 2017-08-01 DIAGNOSIS — M7061 Trochanteric bursitis, right hip: Secondary | ICD-10-CM | POA: Diagnosis not present

## 2017-08-01 DIAGNOSIS — M5136 Other intervertebral disc degeneration, lumbar region: Secondary | ICD-10-CM | POA: Diagnosis not present

## 2017-08-10 DIAGNOSIS — M545 Low back pain: Secondary | ICD-10-CM | POA: Diagnosis not present

## 2017-08-10 DIAGNOSIS — M7061 Trochanteric bursitis, right hip: Secondary | ICD-10-CM | POA: Diagnosis not present

## 2017-08-10 DIAGNOSIS — M5136 Other intervertebral disc degeneration, lumbar region: Secondary | ICD-10-CM | POA: Diagnosis not present

## 2017-08-10 DIAGNOSIS — M419 Scoliosis, unspecified: Secondary | ICD-10-CM | POA: Diagnosis not present

## 2017-08-15 DIAGNOSIS — Z79899 Other long term (current) drug therapy: Secondary | ICD-10-CM | POA: Diagnosis not present

## 2017-08-15 DIAGNOSIS — L409 Psoriasis, unspecified: Secondary | ICD-10-CM | POA: Diagnosis not present

## 2017-08-17 DIAGNOSIS — M545 Low back pain: Secondary | ICD-10-CM | POA: Diagnosis not present

## 2017-08-17 DIAGNOSIS — M419 Scoliosis, unspecified: Secondary | ICD-10-CM | POA: Diagnosis not present

## 2017-08-17 DIAGNOSIS — M5136 Other intervertebral disc degeneration, lumbar region: Secondary | ICD-10-CM | POA: Diagnosis not present

## 2017-08-17 DIAGNOSIS — M7061 Trochanteric bursitis, right hip: Secondary | ICD-10-CM | POA: Diagnosis not present

## 2017-08-23 ENCOUNTER — Telehealth: Payer: Self-pay | Admitting: *Deleted

## 2017-08-23 NOTE — Telephone Encounter (Signed)
Patient called to see if she still needs to take align daily.  She has had no issues or recurrence since C Diff treatment last year.  She states that there are no side bothersome effects of the Align, but she just doesn't like taking medication if she does not need to. Landis Gandy, RN

## 2017-08-24 DIAGNOSIS — M5136 Other intervertebral disc degeneration, lumbar region: Secondary | ICD-10-CM | POA: Diagnosis not present

## 2017-08-24 DIAGNOSIS — M7061 Trochanteric bursitis, right hip: Secondary | ICD-10-CM | POA: Diagnosis not present

## 2017-08-24 DIAGNOSIS — M545 Low back pain: Secondary | ICD-10-CM | POA: Diagnosis not present

## 2017-08-24 DIAGNOSIS — M419 Scoliosis, unspecified: Secondary | ICD-10-CM | POA: Diagnosis not present

## 2017-08-24 NOTE — Telephone Encounter (Signed)
Can stop taking align

## 2017-08-25 NOTE — Telephone Encounter (Signed)
Called patient to inform that per Dr Baxter Flattery she can stop taking the Align. Had to leave a message for her as she did not answer her line.

## 2017-09-04 DIAGNOSIS — M545 Low back pain: Secondary | ICD-10-CM | POA: Diagnosis not present

## 2017-09-04 DIAGNOSIS — M7061 Trochanteric bursitis, right hip: Secondary | ICD-10-CM | POA: Diagnosis not present

## 2017-09-04 DIAGNOSIS — M419 Scoliosis, unspecified: Secondary | ICD-10-CM | POA: Diagnosis not present

## 2017-09-04 DIAGNOSIS — M5136 Other intervertebral disc degeneration, lumbar region: Secondary | ICD-10-CM | POA: Diagnosis not present

## 2017-09-05 DIAGNOSIS — F331 Major depressive disorder, recurrent, moderate: Secondary | ICD-10-CM | POA: Diagnosis not present

## 2017-09-22 DIAGNOSIS — E7849 Other hyperlipidemia: Secondary | ICD-10-CM | POA: Diagnosis not present

## 2017-09-22 DIAGNOSIS — M859 Disorder of bone density and structure, unspecified: Secondary | ICD-10-CM | POA: Diagnosis not present

## 2017-09-22 DIAGNOSIS — N183 Chronic kidney disease, stage 3 (moderate): Secondary | ICD-10-CM | POA: Diagnosis not present

## 2017-09-22 DIAGNOSIS — R82998 Other abnormal findings in urine: Secondary | ICD-10-CM | POA: Diagnosis not present

## 2017-09-26 DIAGNOSIS — Z803 Family history of malignant neoplasm of breast: Secondary | ICD-10-CM | POA: Diagnosis not present

## 2017-09-26 DIAGNOSIS — Z1231 Encounter for screening mammogram for malignant neoplasm of breast: Secondary | ICD-10-CM | POA: Diagnosis not present

## 2017-09-29 DIAGNOSIS — N6489 Other specified disorders of breast: Secondary | ICD-10-CM | POA: Diagnosis not present

## 2017-09-29 DIAGNOSIS — N6042 Mammary duct ectasia of left breast: Secondary | ICD-10-CM | POA: Diagnosis not present

## 2017-10-05 DIAGNOSIS — Z6821 Body mass index (BMI) 21.0-21.9, adult: Secondary | ICD-10-CM | POA: Diagnosis not present

## 2017-10-05 DIAGNOSIS — Z0000 Encounter for general adult medical examination without abnormal findings: Secondary | ICD-10-CM | POA: Diagnosis not present

## 2017-10-05 DIAGNOSIS — Z1389 Encounter for screening for other disorder: Secondary | ICD-10-CM | POA: Diagnosis not present

## 2017-10-05 DIAGNOSIS — J3089 Other allergic rhinitis: Secondary | ICD-10-CM | POA: Diagnosis not present

## 2017-10-05 DIAGNOSIS — M859 Disorder of bone density and structure, unspecified: Secondary | ICD-10-CM | POA: Diagnosis not present

## 2017-10-05 DIAGNOSIS — F418 Other specified anxiety disorders: Secondary | ICD-10-CM | POA: Diagnosis not present

## 2017-10-05 DIAGNOSIS — L408 Other psoriasis: Secondary | ICD-10-CM | POA: Diagnosis not present

## 2017-10-05 DIAGNOSIS — E7849 Other hyperlipidemia: Secondary | ICD-10-CM | POA: Diagnosis not present

## 2017-10-14 DIAGNOSIS — Z1212 Encounter for screening for malignant neoplasm of rectum: Secondary | ICD-10-CM | POA: Diagnosis not present

## 2017-12-16 DIAGNOSIS — M545 Low back pain: Secondary | ICD-10-CM | POA: Diagnosis not present

## 2017-12-16 DIAGNOSIS — H9193 Unspecified hearing loss, bilateral: Secondary | ICD-10-CM | POA: Diagnosis not present

## 2017-12-16 DIAGNOSIS — F418 Other specified anxiety disorders: Secondary | ICD-10-CM | POA: Diagnosis not present

## 2017-12-16 DIAGNOSIS — Z6820 Body mass index (BMI) 20.0-20.9, adult: Secondary | ICD-10-CM | POA: Diagnosis not present

## 2017-12-19 DIAGNOSIS — F331 Major depressive disorder, recurrent, moderate: Secondary | ICD-10-CM | POA: Diagnosis not present

## 2018-02-27 DIAGNOSIS — Z79899 Other long term (current) drug therapy: Secondary | ICD-10-CM | POA: Diagnosis not present

## 2018-02-27 DIAGNOSIS — L821 Other seborrheic keratosis: Secondary | ICD-10-CM | POA: Diagnosis not present

## 2018-02-27 DIAGNOSIS — L409 Psoriasis, unspecified: Secondary | ICD-10-CM | POA: Diagnosis not present

## 2018-04-12 DIAGNOSIS — L821 Other seborrheic keratosis: Secondary | ICD-10-CM | POA: Diagnosis not present

## 2018-04-12 DIAGNOSIS — L814 Other melanin hyperpigmentation: Secondary | ICD-10-CM | POA: Diagnosis not present

## 2018-04-12 DIAGNOSIS — L570 Actinic keratosis: Secondary | ICD-10-CM | POA: Diagnosis not present

## 2018-04-12 DIAGNOSIS — L739 Follicular disorder, unspecified: Secondary | ICD-10-CM | POA: Diagnosis not present

## 2018-04-12 DIAGNOSIS — D485 Neoplasm of uncertain behavior of skin: Secondary | ICD-10-CM | POA: Diagnosis not present

## 2018-04-12 DIAGNOSIS — L400 Psoriasis vulgaris: Secondary | ICD-10-CM | POA: Diagnosis not present

## 2018-04-20 DIAGNOSIS — I872 Venous insufficiency (chronic) (peripheral): Secondary | ICD-10-CM | POA: Diagnosis not present

## 2018-04-20 DIAGNOSIS — Z6821 Body mass index (BMI) 21.0-21.9, adult: Secondary | ICD-10-CM | POA: Diagnosis not present

## 2018-05-18 DIAGNOSIS — Z6821 Body mass index (BMI) 21.0-21.9, adult: Secondary | ICD-10-CM | POA: Diagnosis not present

## 2018-05-18 DIAGNOSIS — M65319 Trigger thumb, unspecified thumb: Secondary | ICD-10-CM | POA: Diagnosis not present

## 2018-06-05 DIAGNOSIS — M65312 Trigger thumb, left thumb: Secondary | ICD-10-CM | POA: Diagnosis not present

## 2018-06-19 DIAGNOSIS — H52203 Unspecified astigmatism, bilateral: Secondary | ICD-10-CM | POA: Diagnosis not present

## 2018-06-19 DIAGNOSIS — H2513 Age-related nuclear cataract, bilateral: Secondary | ICD-10-CM | POA: Diagnosis not present

## 2018-06-30 DIAGNOSIS — Z23 Encounter for immunization: Secondary | ICD-10-CM | POA: Diagnosis not present

## 2018-07-17 DIAGNOSIS — M65312 Trigger thumb, left thumb: Secondary | ICD-10-CM | POA: Diagnosis not present

## 2018-07-28 DIAGNOSIS — M419 Scoliosis, unspecified: Secondary | ICD-10-CM | POA: Diagnosis not present

## 2018-07-28 DIAGNOSIS — J309 Allergic rhinitis, unspecified: Secondary | ICD-10-CM | POA: Diagnosis not present

## 2018-07-28 DIAGNOSIS — M545 Low back pain: Secondary | ICD-10-CM | POA: Diagnosis not present

## 2018-07-28 DIAGNOSIS — M7061 Trochanteric bursitis, right hip: Secondary | ICD-10-CM | POA: Diagnosis not present

## 2018-07-28 DIAGNOSIS — Z6823 Body mass index (BMI) 23.0-23.9, adult: Secondary | ICD-10-CM | POA: Diagnosis not present

## 2018-07-28 DIAGNOSIS — M5136 Other intervertebral disc degeneration, lumbar region: Secondary | ICD-10-CM | POA: Diagnosis not present

## 2018-08-11 DIAGNOSIS — M7061 Trochanteric bursitis, right hip: Secondary | ICD-10-CM | POA: Diagnosis not present

## 2018-08-11 DIAGNOSIS — M545 Low back pain: Secondary | ICD-10-CM | POA: Diagnosis not present

## 2018-08-22 DIAGNOSIS — L409 Psoriasis, unspecified: Secondary | ICD-10-CM | POA: Diagnosis not present

## 2018-08-22 DIAGNOSIS — L821 Other seborrheic keratosis: Secondary | ICD-10-CM | POA: Diagnosis not present

## 2018-08-22 DIAGNOSIS — Z79899 Other long term (current) drug therapy: Secondary | ICD-10-CM | POA: Diagnosis not present

## 2018-09-06 DIAGNOSIS — M545 Low back pain: Secondary | ICD-10-CM | POA: Diagnosis not present

## 2018-09-06 DIAGNOSIS — M25551 Pain in right hip: Secondary | ICD-10-CM | POA: Diagnosis not present

## 2018-09-06 DIAGNOSIS — R2681 Unsteadiness on feet: Secondary | ICD-10-CM | POA: Diagnosis not present

## 2018-09-18 DIAGNOSIS — M545 Low back pain: Secondary | ICD-10-CM | POA: Diagnosis not present

## 2018-09-18 DIAGNOSIS — M25551 Pain in right hip: Secondary | ICD-10-CM | POA: Diagnosis not present

## 2018-09-18 DIAGNOSIS — R2681 Unsteadiness on feet: Secondary | ICD-10-CM | POA: Diagnosis not present

## 2018-10-02 DIAGNOSIS — R2681 Unsteadiness on feet: Secondary | ICD-10-CM | POA: Diagnosis not present

## 2018-10-02 DIAGNOSIS — M25551 Pain in right hip: Secondary | ICD-10-CM | POA: Diagnosis not present

## 2018-10-02 DIAGNOSIS — Z1231 Encounter for screening mammogram for malignant neoplasm of breast: Secondary | ICD-10-CM | POA: Diagnosis not present

## 2018-10-02 DIAGNOSIS — M545 Low back pain: Secondary | ICD-10-CM | POA: Diagnosis not present

## 2018-10-02 DIAGNOSIS — Z803 Family history of malignant neoplasm of breast: Secondary | ICD-10-CM | POA: Diagnosis not present

## 2018-10-10 DIAGNOSIS — E7849 Other hyperlipidemia: Secondary | ICD-10-CM | POA: Diagnosis not present

## 2018-10-10 DIAGNOSIS — M859 Disorder of bone density and structure, unspecified: Secondary | ICD-10-CM | POA: Diagnosis not present

## 2018-10-10 DIAGNOSIS — N183 Chronic kidney disease, stage 3 (moderate): Secondary | ICD-10-CM | POA: Diagnosis not present

## 2018-10-16 DIAGNOSIS — Z6823 Body mass index (BMI) 23.0-23.9, adult: Secondary | ICD-10-CM | POA: Diagnosis not present

## 2018-10-16 DIAGNOSIS — H9193 Unspecified hearing loss, bilateral: Secondary | ICD-10-CM | POA: Diagnosis not present

## 2018-10-16 DIAGNOSIS — G4709 Other insomnia: Secondary | ICD-10-CM | POA: Diagnosis not present

## 2018-10-16 DIAGNOSIS — M25551 Pain in right hip: Secondary | ICD-10-CM | POA: Diagnosis not present

## 2018-10-16 DIAGNOSIS — E7849 Other hyperlipidemia: Secondary | ICD-10-CM | POA: Diagnosis not present

## 2018-10-16 DIAGNOSIS — Z0000 Encounter for general adult medical examination without abnormal findings: Secondary | ICD-10-CM | POA: Diagnosis not present

## 2018-10-16 DIAGNOSIS — M859 Disorder of bone density and structure, unspecified: Secondary | ICD-10-CM | POA: Diagnosis not present

## 2018-10-16 DIAGNOSIS — M65319 Trigger thumb, unspecified thumb: Secondary | ICD-10-CM | POA: Diagnosis not present

## 2018-10-16 DIAGNOSIS — Z1331 Encounter for screening for depression: Secondary | ICD-10-CM | POA: Diagnosis not present

## 2018-10-16 DIAGNOSIS — F418 Other specified anxiety disorders: Secondary | ICD-10-CM | POA: Diagnosis not present

## 2018-10-16 DIAGNOSIS — M545 Low back pain: Secondary | ICD-10-CM | POA: Diagnosis not present

## 2018-10-16 DIAGNOSIS — J309 Allergic rhinitis, unspecified: Secondary | ICD-10-CM | POA: Diagnosis not present

## 2018-10-16 DIAGNOSIS — R82998 Other abnormal findings in urine: Secondary | ICD-10-CM | POA: Diagnosis not present

## 2019-01-18 DIAGNOSIS — M25561 Pain in right knee: Secondary | ICD-10-CM | POA: Diagnosis not present

## 2019-02-07 DIAGNOSIS — M25562 Pain in left knee: Secondary | ICD-10-CM | POA: Diagnosis not present

## 2019-02-07 DIAGNOSIS — M25561 Pain in right knee: Secondary | ICD-10-CM | POA: Diagnosis not present

## 2019-03-05 DIAGNOSIS — Z79899 Other long term (current) drug therapy: Secondary | ICD-10-CM | POA: Diagnosis not present

## 2019-03-05 DIAGNOSIS — L821 Other seborrheic keratosis: Secondary | ICD-10-CM | POA: Diagnosis not present

## 2019-03-05 DIAGNOSIS — L409 Psoriasis, unspecified: Secondary | ICD-10-CM | POA: Diagnosis not present

## 2019-03-06 DIAGNOSIS — H903 Sensorineural hearing loss, bilateral: Secondary | ICD-10-CM | POA: Diagnosis not present

## 2019-03-06 DIAGNOSIS — H9113 Presbycusis, bilateral: Secondary | ICD-10-CM | POA: Diagnosis not present

## 2019-04-23 DIAGNOSIS — L720 Epidermal cyst: Secondary | ICD-10-CM | POA: Diagnosis not present

## 2019-04-23 DIAGNOSIS — L820 Inflamed seborrheic keratosis: Secondary | ICD-10-CM | POA: Diagnosis not present

## 2019-04-23 DIAGNOSIS — L821 Other seborrheic keratosis: Secondary | ICD-10-CM | POA: Diagnosis not present

## 2019-04-23 DIAGNOSIS — L400 Psoriasis vulgaris: Secondary | ICD-10-CM | POA: Diagnosis not present

## 2019-05-04 DIAGNOSIS — Z23 Encounter for immunization: Secondary | ICD-10-CM | POA: Diagnosis not present

## 2019-06-25 DIAGNOSIS — L84 Corns and callosities: Secondary | ICD-10-CM | POA: Diagnosis not present

## 2019-06-25 DIAGNOSIS — L814 Other melanin hyperpigmentation: Secondary | ICD-10-CM | POA: Diagnosis not present

## 2019-06-25 DIAGNOSIS — L400 Psoriasis vulgaris: Secondary | ICD-10-CM | POA: Diagnosis not present

## 2019-06-25 DIAGNOSIS — L245 Irritant contact dermatitis due to other chemical products: Secondary | ICD-10-CM | POA: Diagnosis not present

## 2019-06-25 DIAGNOSIS — D1801 Hemangioma of skin and subcutaneous tissue: Secondary | ICD-10-CM | POA: Diagnosis not present

## 2019-06-25 DIAGNOSIS — L821 Other seborrheic keratosis: Secondary | ICD-10-CM | POA: Diagnosis not present

## 2019-06-25 DIAGNOSIS — D692 Other nonthrombocytopenic purpura: Secondary | ICD-10-CM | POA: Diagnosis not present

## 2019-07-25 ENCOUNTER — Ambulatory Visit: Payer: Medicare Other | Attending: Internal Medicine

## 2019-07-25 DIAGNOSIS — Z20822 Contact with and (suspected) exposure to covid-19: Secondary | ICD-10-CM | POA: Diagnosis not present

## 2019-07-26 LAB — NOVEL CORONAVIRUS, NAA: SARS-CoV-2, NAA: NOT DETECTED

## 2019-08-07 DIAGNOSIS — M5416 Radiculopathy, lumbar region: Secondary | ICD-10-CM | POA: Diagnosis not present

## 2019-09-10 DIAGNOSIS — L409 Psoriasis, unspecified: Secondary | ICD-10-CM | POA: Diagnosis not present

## 2019-09-10 DIAGNOSIS — Z79899 Other long term (current) drug therapy: Secondary | ICD-10-CM | POA: Diagnosis not present

## 2019-09-10 DIAGNOSIS — L821 Other seborrheic keratosis: Secondary | ICD-10-CM | POA: Diagnosis not present

## 2019-10-11 DIAGNOSIS — M859 Disorder of bone density and structure, unspecified: Secondary | ICD-10-CM | POA: Diagnosis not present

## 2019-10-11 DIAGNOSIS — E7849 Other hyperlipidemia: Secondary | ICD-10-CM | POA: Diagnosis not present

## 2019-10-11 DIAGNOSIS — Z1231 Encounter for screening mammogram for malignant neoplasm of breast: Secondary | ICD-10-CM | POA: Diagnosis not present

## 2019-10-16 DIAGNOSIS — H903 Sensorineural hearing loss, bilateral: Secondary | ICD-10-CM | POA: Diagnosis not present

## 2019-10-18 DIAGNOSIS — L409 Psoriasis, unspecified: Secondary | ICD-10-CM | POA: Diagnosis not present

## 2019-10-18 DIAGNOSIS — M419 Scoliosis, unspecified: Secondary | ICD-10-CM | POA: Diagnosis not present

## 2019-10-18 DIAGNOSIS — M859 Disorder of bone density and structure, unspecified: Secondary | ICD-10-CM | POA: Diagnosis not present

## 2019-10-18 DIAGNOSIS — E7849 Other hyperlipidemia: Secondary | ICD-10-CM | POA: Diagnosis not present

## 2019-10-18 DIAGNOSIS — M545 Low back pain: Secondary | ICD-10-CM | POA: Diagnosis not present

## 2019-10-18 DIAGNOSIS — J309 Allergic rhinitis, unspecified: Secondary | ICD-10-CM | POA: Diagnosis not present

## 2019-10-18 DIAGNOSIS — F418 Other specified anxiety disorders: Secondary | ICD-10-CM | POA: Diagnosis not present

## 2019-10-18 DIAGNOSIS — G4700 Insomnia, unspecified: Secondary | ICD-10-CM | POA: Diagnosis not present

## 2019-10-18 DIAGNOSIS — Z0000 Encounter for general adult medical examination without abnormal findings: Secondary | ICD-10-CM | POA: Diagnosis not present

## 2019-10-18 DIAGNOSIS — R82998 Other abnormal findings in urine: Secondary | ICD-10-CM | POA: Diagnosis not present

## 2020-02-28 DIAGNOSIS — M5416 Radiculopathy, lumbar region: Secondary | ICD-10-CM | POA: Diagnosis not present

## 2020-03-03 DIAGNOSIS — H903 Sensorineural hearing loss, bilateral: Secondary | ICD-10-CM | POA: Diagnosis not present

## 2020-03-05 DIAGNOSIS — N390 Urinary tract infection, site not specified: Secondary | ICD-10-CM | POA: Diagnosis not present

## 2020-03-14 DIAGNOSIS — R351 Nocturia: Secondary | ICD-10-CM | POA: Diagnosis not present

## 2020-03-14 DIAGNOSIS — N390 Urinary tract infection, site not specified: Secondary | ICD-10-CM | POA: Diagnosis not present

## 2020-04-15 DIAGNOSIS — Z23 Encounter for immunization: Secondary | ICD-10-CM | POA: Diagnosis not present

## 2020-05-13 DIAGNOSIS — Z79899 Other long term (current) drug therapy: Secondary | ICD-10-CM | POA: Diagnosis not present

## 2020-05-13 DIAGNOSIS — L409 Psoriasis, unspecified: Secondary | ICD-10-CM | POA: Diagnosis not present

## 2020-05-13 DIAGNOSIS — L821 Other seborrheic keratosis: Secondary | ICD-10-CM | POA: Diagnosis not present

## 2020-06-10 DIAGNOSIS — Z23 Encounter for immunization: Secondary | ICD-10-CM | POA: Diagnosis not present

## 2020-06-23 DIAGNOSIS — M5136 Other intervertebral disc degeneration, lumbar region: Secondary | ICD-10-CM | POA: Diagnosis not present

## 2020-06-23 DIAGNOSIS — M25551 Pain in right hip: Secondary | ICD-10-CM | POA: Diagnosis not present

## 2020-06-23 DIAGNOSIS — M4180 Other forms of scoliosis, site unspecified: Secondary | ICD-10-CM | POA: Diagnosis not present

## 2020-07-04 DIAGNOSIS — M5416 Radiculopathy, lumbar region: Secondary | ICD-10-CM | POA: Diagnosis not present

## 2020-07-23 DIAGNOSIS — H903 Sensorineural hearing loss, bilateral: Secondary | ICD-10-CM | POA: Diagnosis not present

## 2020-07-23 DIAGNOSIS — H838X3 Other specified diseases of inner ear, bilateral: Secondary | ICD-10-CM | POA: Diagnosis not present

## 2020-07-24 ENCOUNTER — Other Ambulatory Visit: Payer: Self-pay | Admitting: Otolaryngology

## 2020-07-24 DIAGNOSIS — H918X9 Other specified hearing loss, unspecified ear: Secondary | ICD-10-CM

## 2020-07-25 DIAGNOSIS — D1801 Hemangioma of skin and subcutaneous tissue: Secondary | ICD-10-CM | POA: Diagnosis not present

## 2020-07-25 DIAGNOSIS — H52203 Unspecified astigmatism, bilateral: Secondary | ICD-10-CM | POA: Diagnosis not present

## 2020-07-25 DIAGNOSIS — H2513 Age-related nuclear cataract, bilateral: Secondary | ICD-10-CM | POA: Diagnosis not present

## 2020-07-25 DIAGNOSIS — L570 Actinic keratosis: Secondary | ICD-10-CM | POA: Diagnosis not present

## 2020-07-25 DIAGNOSIS — L814 Other melanin hyperpigmentation: Secondary | ICD-10-CM | POA: Diagnosis not present

## 2020-07-25 DIAGNOSIS — L821 Other seborrheic keratosis: Secondary | ICD-10-CM | POA: Diagnosis not present

## 2020-08-05 DIAGNOSIS — G5701 Lesion of sciatic nerve, right lower limb: Secondary | ICD-10-CM | POA: Diagnosis not present

## 2020-08-05 DIAGNOSIS — M5136 Other intervertebral disc degeneration, lumbar region: Secondary | ICD-10-CM | POA: Diagnosis not present

## 2020-08-15 ENCOUNTER — Ambulatory Visit
Admission: RE | Admit: 2020-08-15 | Discharge: 2020-08-15 | Disposition: A | Discharge status: Home / Self Care | Payer: Medicare Other | Source: Ambulatory Visit | Attending: Otolaryngology | Admitting: Otolaryngology

## 2020-08-15 ENCOUNTER — Other Ambulatory Visit: Payer: Self-pay

## 2020-08-15 DIAGNOSIS — H918X9 Other specified hearing loss, unspecified ear: Secondary | ICD-10-CM

## 2020-08-15 DIAGNOSIS — H903 Sensorineural hearing loss, bilateral: Secondary | ICD-10-CM | POA: Diagnosis not present

## 2020-08-15 MED ORDER — GADOBENATE DIMEGLUMINE 529 MG/ML IV SOLN
10.0000 mL | Freq: Once | INTRAVENOUS | Status: AC | PRN
  Administered 2020-08-15: 10 mL via INTRAVENOUS

## 2020-08-27 DIAGNOSIS — M5416 Radiculopathy, lumbar region: Secondary | ICD-10-CM | POA: Diagnosis not present

## 2020-08-27 DIAGNOSIS — M5459 Other low back pain: Secondary | ICD-10-CM | POA: Diagnosis not present

## 2020-09-02 DIAGNOSIS — F419 Anxiety disorder, unspecified: Secondary | ICD-10-CM | POA: Diagnosis not present

## 2020-09-02 DIAGNOSIS — E785 Hyperlipidemia, unspecified: Secondary | ICD-10-CM | POA: Diagnosis not present

## 2020-09-02 DIAGNOSIS — E237 Disorder of pituitary gland, unspecified: Secondary | ICD-10-CM | POA: Diagnosis not present

## 2020-09-02 DIAGNOSIS — H9193 Unspecified hearing loss, bilateral: Secondary | ICD-10-CM | POA: Diagnosis not present

## 2020-09-02 DIAGNOSIS — G4700 Insomnia, unspecified: Secondary | ICD-10-CM | POA: Diagnosis not present

## 2020-09-02 DIAGNOSIS — M5136 Other intervertebral disc degeneration, lumbar region: Secondary | ICD-10-CM | POA: Diagnosis not present

## 2020-09-04 DIAGNOSIS — E237 Disorder of pituitary gland, unspecified: Secondary | ICD-10-CM | POA: Diagnosis not present

## 2020-09-05 DIAGNOSIS — M5416 Radiculopathy, lumbar region: Secondary | ICD-10-CM | POA: Diagnosis not present

## 2020-09-05 DIAGNOSIS — M5136 Other intervertebral disc degeneration, lumbar region: Secondary | ICD-10-CM | POA: Diagnosis not present

## 2020-09-17 DIAGNOSIS — M545 Low back pain, unspecified: Secondary | ICD-10-CM | POA: Diagnosis not present

## 2020-09-17 DIAGNOSIS — M25551 Pain in right hip: Secondary | ICD-10-CM | POA: Diagnosis not present

## 2020-09-17 DIAGNOSIS — R2681 Unsteadiness on feet: Secondary | ICD-10-CM | POA: Diagnosis not present

## 2020-09-23 DIAGNOSIS — M25551 Pain in right hip: Secondary | ICD-10-CM | POA: Diagnosis not present

## 2020-09-23 DIAGNOSIS — M545 Low back pain, unspecified: Secondary | ICD-10-CM | POA: Diagnosis not present

## 2020-09-23 DIAGNOSIS — R2681 Unsteadiness on feet: Secondary | ICD-10-CM | POA: Diagnosis not present

## 2020-09-25 DIAGNOSIS — M5416 Radiculopathy, lumbar region: Secondary | ICD-10-CM | POA: Diagnosis not present

## 2020-09-30 DIAGNOSIS — R2681 Unsteadiness on feet: Secondary | ICD-10-CM | POA: Diagnosis not present

## 2020-09-30 DIAGNOSIS — M545 Low back pain, unspecified: Secondary | ICD-10-CM | POA: Diagnosis not present

## 2020-09-30 DIAGNOSIS — M25551 Pain in right hip: Secondary | ICD-10-CM | POA: Diagnosis not present

## 2020-10-07 DIAGNOSIS — R2681 Unsteadiness on feet: Secondary | ICD-10-CM | POA: Diagnosis not present

## 2020-10-07 DIAGNOSIS — M545 Low back pain, unspecified: Secondary | ICD-10-CM | POA: Diagnosis not present

## 2020-10-07 DIAGNOSIS — M25551 Pain in right hip: Secondary | ICD-10-CM | POA: Diagnosis not present

## 2020-10-15 DIAGNOSIS — R2681 Unsteadiness on feet: Secondary | ICD-10-CM | POA: Diagnosis not present

## 2020-10-15 DIAGNOSIS — M545 Low back pain, unspecified: Secondary | ICD-10-CM | POA: Diagnosis not present

## 2020-10-15 DIAGNOSIS — M25551 Pain in right hip: Secondary | ICD-10-CM | POA: Diagnosis not present

## 2020-10-16 DIAGNOSIS — M859 Disorder of bone density and structure, unspecified: Secondary | ICD-10-CM | POA: Diagnosis not present

## 2020-10-16 DIAGNOSIS — E785 Hyperlipidemia, unspecified: Secondary | ICD-10-CM | POA: Diagnosis not present

## 2020-10-16 DIAGNOSIS — M5416 Radiculopathy, lumbar region: Secondary | ICD-10-CM | POA: Diagnosis not present

## 2020-10-23 DIAGNOSIS — M419 Scoliosis, unspecified: Secondary | ICD-10-CM | POA: Diagnosis not present

## 2020-10-23 DIAGNOSIS — M5136 Other intervertebral disc degeneration, lumbar region: Secondary | ICD-10-CM | POA: Diagnosis not present

## 2020-10-23 DIAGNOSIS — Z0000 Encounter for general adult medical examination without abnormal findings: Secondary | ICD-10-CM | POA: Diagnosis not present

## 2020-10-23 DIAGNOSIS — M8580 Other specified disorders of bone density and structure, unspecified site: Secondary | ICD-10-CM | POA: Diagnosis not present

## 2020-10-23 DIAGNOSIS — E785 Hyperlipidemia, unspecified: Secondary | ICD-10-CM | POA: Diagnosis not present

## 2020-10-23 DIAGNOSIS — G4700 Insomnia, unspecified: Secondary | ICD-10-CM | POA: Diagnosis not present

## 2020-10-23 DIAGNOSIS — R2681 Unsteadiness on feet: Secondary | ICD-10-CM | POA: Diagnosis not present

## 2020-10-23 DIAGNOSIS — R82998 Other abnormal findings in urine: Secondary | ICD-10-CM | POA: Diagnosis not present

## 2020-10-23 DIAGNOSIS — R0982 Postnasal drip: Secondary | ICD-10-CM | POA: Diagnosis not present

## 2020-10-23 DIAGNOSIS — H9193 Unspecified hearing loss, bilateral: Secondary | ICD-10-CM | POA: Diagnosis not present

## 2020-10-23 DIAGNOSIS — K5900 Constipation, unspecified: Secondary | ICD-10-CM | POA: Diagnosis not present

## 2020-10-29 DIAGNOSIS — R2681 Unsteadiness on feet: Secondary | ICD-10-CM | POA: Diagnosis not present

## 2020-10-29 DIAGNOSIS — M545 Low back pain, unspecified: Secondary | ICD-10-CM | POA: Diagnosis not present

## 2020-10-29 DIAGNOSIS — M25551 Pain in right hip: Secondary | ICD-10-CM | POA: Diagnosis not present

## 2020-10-29 DIAGNOSIS — M4180 Other forms of scoliosis, site unspecified: Secondary | ICD-10-CM | POA: Diagnosis not present

## 2020-10-29 DIAGNOSIS — M5136 Other intervertebral disc degeneration, lumbar region: Secondary | ICD-10-CM | POA: Diagnosis not present

## 2020-11-03 DIAGNOSIS — Z803 Family history of malignant neoplasm of breast: Secondary | ICD-10-CM | POA: Diagnosis not present

## 2020-11-03 DIAGNOSIS — Z1231 Encounter for screening mammogram for malignant neoplasm of breast: Secondary | ICD-10-CM | POA: Diagnosis not present

## 2020-11-12 DIAGNOSIS — M545 Low back pain, unspecified: Secondary | ICD-10-CM | POA: Diagnosis not present

## 2020-11-12 DIAGNOSIS — M25551 Pain in right hip: Secondary | ICD-10-CM | POA: Diagnosis not present

## 2020-11-12 DIAGNOSIS — R2681 Unsteadiness on feet: Secondary | ICD-10-CM | POA: Diagnosis not present

## 2020-11-19 DIAGNOSIS — M25551 Pain in right hip: Secondary | ICD-10-CM | POA: Diagnosis not present

## 2020-11-19 DIAGNOSIS — M545 Low back pain, unspecified: Secondary | ICD-10-CM | POA: Diagnosis not present

## 2020-11-19 DIAGNOSIS — R2681 Unsteadiness on feet: Secondary | ICD-10-CM | POA: Diagnosis not present

## 2020-11-28 DIAGNOSIS — M25551 Pain in right hip: Secondary | ICD-10-CM | POA: Diagnosis not present

## 2020-11-28 DIAGNOSIS — M545 Low back pain, unspecified: Secondary | ICD-10-CM | POA: Diagnosis not present

## 2020-11-28 DIAGNOSIS — R2681 Unsteadiness on feet: Secondary | ICD-10-CM | POA: Diagnosis not present

## 2020-12-03 DIAGNOSIS — R2681 Unsteadiness on feet: Secondary | ICD-10-CM | POA: Diagnosis not present

## 2020-12-03 DIAGNOSIS — M545 Low back pain, unspecified: Secondary | ICD-10-CM | POA: Diagnosis not present

## 2020-12-03 DIAGNOSIS — M25551 Pain in right hip: Secondary | ICD-10-CM | POA: Diagnosis not present

## 2020-12-10 DIAGNOSIS — R2681 Unsteadiness on feet: Secondary | ICD-10-CM | POA: Diagnosis not present

## 2020-12-10 DIAGNOSIS — M25551 Pain in right hip: Secondary | ICD-10-CM | POA: Diagnosis not present

## 2020-12-10 DIAGNOSIS — M545 Low back pain, unspecified: Secondary | ICD-10-CM | POA: Diagnosis not present

## 2020-12-17 DIAGNOSIS — R2681 Unsteadiness on feet: Secondary | ICD-10-CM | POA: Diagnosis not present

## 2020-12-17 DIAGNOSIS — M25551 Pain in right hip: Secondary | ICD-10-CM | POA: Diagnosis not present

## 2020-12-17 DIAGNOSIS — M545 Low back pain, unspecified: Secondary | ICD-10-CM | POA: Diagnosis not present

## 2020-12-24 DIAGNOSIS — M545 Low back pain, unspecified: Secondary | ICD-10-CM | POA: Diagnosis not present

## 2020-12-24 DIAGNOSIS — M25551 Pain in right hip: Secondary | ICD-10-CM | POA: Diagnosis not present

## 2020-12-24 DIAGNOSIS — R2681 Unsteadiness on feet: Secondary | ICD-10-CM | POA: Diagnosis not present

## 2021-01-01 DIAGNOSIS — Z23 Encounter for immunization: Secondary | ICD-10-CM | POA: Diagnosis not present

## 2021-01-20 DIAGNOSIS — L821 Other seborrheic keratosis: Secondary | ICD-10-CM | POA: Diagnosis not present

## 2021-01-20 DIAGNOSIS — L409 Psoriasis, unspecified: Secondary | ICD-10-CM | POA: Diagnosis not present

## 2021-01-20 DIAGNOSIS — Z79899 Other long term (current) drug therapy: Secondary | ICD-10-CM | POA: Diagnosis not present

## 2021-05-11 DIAGNOSIS — Z23 Encounter for immunization: Secondary | ICD-10-CM | POA: Diagnosis not present

## 2021-05-19 DIAGNOSIS — M5416 Radiculopathy, lumbar region: Secondary | ICD-10-CM | POA: Diagnosis not present

## 2021-05-28 DIAGNOSIS — M5416 Radiculopathy, lumbar region: Secondary | ICD-10-CM | POA: Diagnosis not present

## 2021-06-04 ENCOUNTER — Other Ambulatory Visit: Payer: Self-pay | Admitting: Physical Medicine and Rehabilitation

## 2021-06-04 DIAGNOSIS — M5459 Other low back pain: Secondary | ICD-10-CM

## 2021-06-05 DIAGNOSIS — Z23 Encounter for immunization: Secondary | ICD-10-CM | POA: Diagnosis not present

## 2021-06-08 ENCOUNTER — Other Ambulatory Visit: Payer: Self-pay

## 2021-06-08 ENCOUNTER — Ambulatory Visit
Admission: RE | Admit: 2021-06-08 | Discharge: 2021-06-08 | Disposition: A | Discharge status: Home / Self Care | Payer: Medicare Other | Source: Ambulatory Visit | Attending: Physical Medicine and Rehabilitation | Admitting: Physical Medicine and Rehabilitation

## 2021-06-08 DIAGNOSIS — M5459 Other low back pain: Secondary | ICD-10-CM

## 2021-06-08 DIAGNOSIS — M545 Low back pain, unspecified: Secondary | ICD-10-CM | POA: Diagnosis not present

## 2021-06-08 DIAGNOSIS — M5416 Radiculopathy, lumbar region: Secondary | ICD-10-CM | POA: Diagnosis not present

## 2021-06-08 DIAGNOSIS — M4807 Spinal stenosis, lumbosacral region: Secondary | ICD-10-CM | POA: Diagnosis not present

## 2021-06-08 DIAGNOSIS — M47816 Spondylosis without myelopathy or radiculopathy, lumbar region: Secondary | ICD-10-CM | POA: Diagnosis not present

## 2021-06-08 DIAGNOSIS — M48061 Spinal stenosis, lumbar region without neurogenic claudication: Secondary | ICD-10-CM | POA: Diagnosis not present

## 2021-06-08 DIAGNOSIS — M2578 Osteophyte, vertebrae: Secondary | ICD-10-CM | POA: Diagnosis not present

## 2021-06-19 DIAGNOSIS — R2681 Unsteadiness on feet: Secondary | ICD-10-CM | POA: Diagnosis not present

## 2021-06-19 DIAGNOSIS — M545 Low back pain, unspecified: Secondary | ICD-10-CM | POA: Diagnosis not present

## 2021-06-19 DIAGNOSIS — M25551 Pain in right hip: Secondary | ICD-10-CM | POA: Diagnosis not present

## 2021-06-23 ENCOUNTER — Other Ambulatory Visit: Payer: Medicare Other

## 2021-06-26 DIAGNOSIS — M545 Low back pain, unspecified: Secondary | ICD-10-CM | POA: Diagnosis not present

## 2021-06-26 DIAGNOSIS — R2681 Unsteadiness on feet: Secondary | ICD-10-CM | POA: Diagnosis not present

## 2021-06-26 DIAGNOSIS — M25551 Pain in right hip: Secondary | ICD-10-CM | POA: Diagnosis not present

## 2021-06-30 DIAGNOSIS — M5416 Radiculopathy, lumbar region: Secondary | ICD-10-CM | POA: Diagnosis not present

## 2021-07-17 DIAGNOSIS — T22232A Burn of second degree of left upper arm, initial encounter: Secondary | ICD-10-CM | POA: Diagnosis not present

## 2021-07-22 DIAGNOSIS — M25551 Pain in right hip: Secondary | ICD-10-CM | POA: Diagnosis not present

## 2021-07-22 DIAGNOSIS — M545 Low back pain, unspecified: Secondary | ICD-10-CM | POA: Diagnosis not present

## 2021-07-22 DIAGNOSIS — R2681 Unsteadiness on feet: Secondary | ICD-10-CM | POA: Diagnosis not present

## 2021-07-29 DIAGNOSIS — H2513 Age-related nuclear cataract, bilateral: Secondary | ICD-10-CM | POA: Diagnosis not present

## 2021-07-29 DIAGNOSIS — H52203 Unspecified astigmatism, bilateral: Secondary | ICD-10-CM | POA: Diagnosis not present

## 2021-07-31 DIAGNOSIS — M25551 Pain in right hip: Secondary | ICD-10-CM | POA: Diagnosis not present

## 2021-07-31 DIAGNOSIS — M545 Low back pain, unspecified: Secondary | ICD-10-CM | POA: Diagnosis not present

## 2021-07-31 DIAGNOSIS — R2681 Unsteadiness on feet: Secondary | ICD-10-CM | POA: Diagnosis not present

## 2021-08-04 DIAGNOSIS — L821 Other seborrheic keratosis: Secondary | ICD-10-CM | POA: Diagnosis not present

## 2021-08-04 DIAGNOSIS — D225 Melanocytic nevi of trunk: Secondary | ICD-10-CM | POA: Diagnosis not present

## 2021-08-04 DIAGNOSIS — L814 Other melanin hyperpigmentation: Secondary | ICD-10-CM | POA: Diagnosis not present

## 2021-08-04 DIAGNOSIS — D1801 Hemangioma of skin and subcutaneous tissue: Secondary | ICD-10-CM | POA: Diagnosis not present

## 2021-08-04 DIAGNOSIS — L905 Scar conditions and fibrosis of skin: Secondary | ICD-10-CM | POA: Diagnosis not present

## 2021-08-04 DIAGNOSIS — L298 Other pruritus: Secondary | ICD-10-CM | POA: Diagnosis not present

## 2021-08-05 DIAGNOSIS — M25551 Pain in right hip: Secondary | ICD-10-CM | POA: Diagnosis not present

## 2021-08-05 DIAGNOSIS — R2681 Unsteadiness on feet: Secondary | ICD-10-CM | POA: Diagnosis not present

## 2021-08-05 DIAGNOSIS — M545 Low back pain, unspecified: Secondary | ICD-10-CM | POA: Diagnosis not present

## 2021-08-12 DIAGNOSIS — R2681 Unsteadiness on feet: Secondary | ICD-10-CM | POA: Diagnosis not present

## 2021-08-12 DIAGNOSIS — M25551 Pain in right hip: Secondary | ICD-10-CM | POA: Diagnosis not present

## 2021-08-12 DIAGNOSIS — M545 Low back pain, unspecified: Secondary | ICD-10-CM | POA: Diagnosis not present

## 2021-08-19 DIAGNOSIS — R2681 Unsteadiness on feet: Secondary | ICD-10-CM | POA: Diagnosis not present

## 2021-08-19 DIAGNOSIS — M25551 Pain in right hip: Secondary | ICD-10-CM | POA: Diagnosis not present

## 2021-08-19 DIAGNOSIS — M545 Low back pain, unspecified: Secondary | ICD-10-CM | POA: Diagnosis not present

## 2021-08-26 DIAGNOSIS — R2681 Unsteadiness on feet: Secondary | ICD-10-CM | POA: Diagnosis not present

## 2021-08-26 DIAGNOSIS — M545 Low back pain, unspecified: Secondary | ICD-10-CM | POA: Diagnosis not present

## 2021-08-26 DIAGNOSIS — M25551 Pain in right hip: Secondary | ICD-10-CM | POA: Diagnosis not present

## 2021-08-28 DIAGNOSIS — L400 Psoriasis vulgaris: Secondary | ICD-10-CM | POA: Diagnosis not present

## 2021-09-02 DIAGNOSIS — R2681 Unsteadiness on feet: Secondary | ICD-10-CM | POA: Diagnosis not present

## 2021-09-02 DIAGNOSIS — M545 Low back pain, unspecified: Secondary | ICD-10-CM | POA: Diagnosis not present

## 2021-09-02 DIAGNOSIS — M25551 Pain in right hip: Secondary | ICD-10-CM | POA: Diagnosis not present

## 2021-09-09 DIAGNOSIS — M545 Low back pain, unspecified: Secondary | ICD-10-CM | POA: Diagnosis not present

## 2021-09-09 DIAGNOSIS — M25551 Pain in right hip: Secondary | ICD-10-CM | POA: Diagnosis not present

## 2021-09-09 DIAGNOSIS — R2681 Unsteadiness on feet: Secondary | ICD-10-CM | POA: Diagnosis not present

## 2021-09-18 DIAGNOSIS — M25551 Pain in right hip: Secondary | ICD-10-CM | POA: Diagnosis not present

## 2021-09-18 DIAGNOSIS — R2681 Unsteadiness on feet: Secondary | ICD-10-CM | POA: Diagnosis not present

## 2021-09-18 DIAGNOSIS — M545 Low back pain, unspecified: Secondary | ICD-10-CM | POA: Diagnosis not present

## 2021-09-23 DIAGNOSIS — R2681 Unsteadiness on feet: Secondary | ICD-10-CM | POA: Diagnosis not present

## 2021-09-23 DIAGNOSIS — M25551 Pain in right hip: Secondary | ICD-10-CM | POA: Diagnosis not present

## 2021-09-23 DIAGNOSIS — M545 Low back pain, unspecified: Secondary | ICD-10-CM | POA: Diagnosis not present

## 2021-09-30 DIAGNOSIS — M545 Low back pain, unspecified: Secondary | ICD-10-CM | POA: Diagnosis not present

## 2021-09-30 DIAGNOSIS — M25551 Pain in right hip: Secondary | ICD-10-CM | POA: Diagnosis not present

## 2021-09-30 DIAGNOSIS — R2681 Unsteadiness on feet: Secondary | ICD-10-CM | POA: Diagnosis not present

## 2021-10-02 DIAGNOSIS — L400 Psoriasis vulgaris: Secondary | ICD-10-CM | POA: Diagnosis not present

## 2021-10-05 DIAGNOSIS — Z79899 Other long term (current) drug therapy: Secondary | ICD-10-CM | POA: Diagnosis not present

## 2021-10-05 DIAGNOSIS — L820 Inflamed seborrheic keratosis: Secondary | ICD-10-CM | POA: Diagnosis not present

## 2021-10-05 DIAGNOSIS — L400 Psoriasis vulgaris: Secondary | ICD-10-CM | POA: Diagnosis not present

## 2021-10-05 DIAGNOSIS — L821 Other seborrheic keratosis: Secondary | ICD-10-CM | POA: Diagnosis not present

## 2021-10-05 DIAGNOSIS — L409 Psoriasis, unspecified: Secondary | ICD-10-CM | POA: Diagnosis not present

## 2021-10-07 DIAGNOSIS — R2681 Unsteadiness on feet: Secondary | ICD-10-CM | POA: Diagnosis not present

## 2021-10-07 DIAGNOSIS — M545 Low back pain, unspecified: Secondary | ICD-10-CM | POA: Diagnosis not present

## 2021-10-07 DIAGNOSIS — M25551 Pain in right hip: Secondary | ICD-10-CM | POA: Diagnosis not present

## 2021-10-14 DIAGNOSIS — M545 Low back pain, unspecified: Secondary | ICD-10-CM | POA: Diagnosis not present

## 2021-10-14 DIAGNOSIS — R2681 Unsteadiness on feet: Secondary | ICD-10-CM | POA: Diagnosis not present

## 2021-10-14 DIAGNOSIS — M25551 Pain in right hip: Secondary | ICD-10-CM | POA: Diagnosis not present

## 2021-10-28 DIAGNOSIS — M25551 Pain in right hip: Secondary | ICD-10-CM | POA: Diagnosis not present

## 2021-10-28 DIAGNOSIS — R2681 Unsteadiness on feet: Secondary | ICD-10-CM | POA: Diagnosis not present

## 2021-10-28 DIAGNOSIS — M545 Low back pain, unspecified: Secondary | ICD-10-CM | POA: Diagnosis not present

## 2021-11-04 DIAGNOSIS — R2681 Unsteadiness on feet: Secondary | ICD-10-CM | POA: Diagnosis not present

## 2021-11-04 DIAGNOSIS — M545 Low back pain, unspecified: Secondary | ICD-10-CM | POA: Diagnosis not present

## 2021-11-04 DIAGNOSIS — M25551 Pain in right hip: Secondary | ICD-10-CM | POA: Diagnosis not present

## 2021-11-09 DIAGNOSIS — E785 Hyperlipidemia, unspecified: Secondary | ICD-10-CM | POA: Diagnosis not present

## 2021-11-09 DIAGNOSIS — E559 Vitamin D deficiency, unspecified: Secondary | ICD-10-CM | POA: Diagnosis not present

## 2021-11-09 DIAGNOSIS — M859 Disorder of bone density and structure, unspecified: Secondary | ICD-10-CM | POA: Diagnosis not present

## 2021-11-11 DIAGNOSIS — M25551 Pain in right hip: Secondary | ICD-10-CM | POA: Diagnosis not present

## 2021-11-11 DIAGNOSIS — M545 Low back pain, unspecified: Secondary | ICD-10-CM | POA: Diagnosis not present

## 2021-11-11 DIAGNOSIS — R2681 Unsteadiness on feet: Secondary | ICD-10-CM | POA: Diagnosis not present

## 2021-11-17 DIAGNOSIS — E559 Vitamin D deficiency, unspecified: Secondary | ICD-10-CM | POA: Diagnosis not present

## 2021-11-17 DIAGNOSIS — Z0000 Encounter for general adult medical examination without abnormal findings: Secondary | ICD-10-CM | POA: Diagnosis not present

## 2021-11-17 DIAGNOSIS — M5136 Other intervertebral disc degeneration, lumbar region: Secondary | ICD-10-CM | POA: Diagnosis not present

## 2021-11-17 DIAGNOSIS — J309 Allergic rhinitis, unspecified: Secondary | ICD-10-CM | POA: Diagnosis not present

## 2021-11-17 DIAGNOSIS — L989 Disorder of the skin and subcutaneous tissue, unspecified: Secondary | ICD-10-CM | POA: Diagnosis not present

## 2021-11-17 DIAGNOSIS — E785 Hyperlipidemia, unspecified: Secondary | ICD-10-CM | POA: Diagnosis not present

## 2021-11-17 DIAGNOSIS — G4700 Insomnia, unspecified: Secondary | ICD-10-CM | POA: Diagnosis not present

## 2021-11-17 DIAGNOSIS — R0982 Postnasal drip: Secondary | ICD-10-CM | POA: Diagnosis not present

## 2021-11-17 DIAGNOSIS — H9193 Unspecified hearing loss, bilateral: Secondary | ICD-10-CM | POA: Diagnosis not present

## 2021-11-17 DIAGNOSIS — F419 Anxiety disorder, unspecified: Secondary | ICD-10-CM | POA: Diagnosis not present

## 2021-11-17 DIAGNOSIS — Z1339 Encounter for screening examination for other mental health and behavioral disorders: Secondary | ICD-10-CM | POA: Diagnosis not present

## 2021-11-17 DIAGNOSIS — Z1331 Encounter for screening for depression: Secondary | ICD-10-CM | POA: Diagnosis not present

## 2021-11-18 DIAGNOSIS — M25551 Pain in right hip: Secondary | ICD-10-CM | POA: Diagnosis not present

## 2021-11-18 DIAGNOSIS — R82998 Other abnormal findings in urine: Secondary | ICD-10-CM | POA: Diagnosis not present

## 2021-11-18 DIAGNOSIS — R2681 Unsteadiness on feet: Secondary | ICD-10-CM | POA: Diagnosis not present

## 2021-11-18 DIAGNOSIS — M545 Low back pain, unspecified: Secondary | ICD-10-CM | POA: Diagnosis not present

## 2021-11-25 DIAGNOSIS — M545 Low back pain, unspecified: Secondary | ICD-10-CM | POA: Diagnosis not present

## 2021-11-25 DIAGNOSIS — R2681 Unsteadiness on feet: Secondary | ICD-10-CM | POA: Diagnosis not present

## 2021-11-25 DIAGNOSIS — M25551 Pain in right hip: Secondary | ICD-10-CM | POA: Diagnosis not present

## 2021-12-08 DIAGNOSIS — Z1231 Encounter for screening mammogram for malignant neoplasm of breast: Secondary | ICD-10-CM | POA: Diagnosis not present

## 2021-12-16 DIAGNOSIS — R922 Inconclusive mammogram: Secondary | ICD-10-CM | POA: Diagnosis not present

## 2021-12-16 DIAGNOSIS — R928 Other abnormal and inconclusive findings on diagnostic imaging of breast: Secondary | ICD-10-CM | POA: Diagnosis not present

## 2021-12-23 DIAGNOSIS — R2681 Unsteadiness on feet: Secondary | ICD-10-CM | POA: Diagnosis not present

## 2021-12-23 DIAGNOSIS — M25551 Pain in right hip: Secondary | ICD-10-CM | POA: Diagnosis not present

## 2021-12-23 DIAGNOSIS — M545 Low back pain, unspecified: Secondary | ICD-10-CM | POA: Diagnosis not present

## 2021-12-25 DIAGNOSIS — L400 Psoriasis vulgaris: Secondary | ICD-10-CM | POA: Diagnosis not present

## 2022-02-01 ENCOUNTER — Other Ambulatory Visit: Payer: Self-pay

## 2022-02-01 ENCOUNTER — Encounter (HOSPITAL_COMMUNITY): Payer: Self-pay | Admitting: Emergency Medicine

## 2022-02-01 ENCOUNTER — Emergency Department (HOSPITAL_COMMUNITY)
Admission: EM | Admit: 2022-02-01 | Discharge: 2022-02-01 | Disposition: A | Discharge status: Home / Self Care | Payer: Medicare Other | Attending: Emergency Medicine | Admitting: Emergency Medicine

## 2022-02-01 ENCOUNTER — Ambulatory Visit (HOSPITAL_COMMUNITY): Payer: Medicare Other

## 2022-02-01 ENCOUNTER — Emergency Department (HOSPITAL_COMMUNITY): Payer: Medicare Other

## 2022-02-01 DIAGNOSIS — R519 Headache, unspecified: Secondary | ICD-10-CM | POA: Insufficient documentation

## 2022-02-01 DIAGNOSIS — W01198A Fall on same level from slipping, tripping and stumbling with subsequent striking against other object, initial encounter: Secondary | ICD-10-CM | POA: Diagnosis not present

## 2022-02-01 DIAGNOSIS — Z043 Encounter for examination and observation following other accident: Secondary | ICD-10-CM | POA: Diagnosis not present

## 2022-02-01 DIAGNOSIS — Z5321 Procedure and treatment not carried out due to patient leaving prior to being seen by health care provider: Secondary | ICD-10-CM | POA: Diagnosis not present

## 2022-02-01 DIAGNOSIS — M47812 Spondylosis without myelopathy or radiculopathy, cervical region: Secondary | ICD-10-CM | POA: Diagnosis not present

## 2022-02-01 DIAGNOSIS — S01112A Laceration without foreign body of left eyelid and periocular area, initial encounter: Secondary | ICD-10-CM | POA: Diagnosis not present

## 2022-02-01 NOTE — ED Provider Triage Note (Signed)
Emergency Medicine Provider Triage Evaluation Note  Ashley Mcneil , a 86 y.o. female  was evaluated in triage.  Pt complains of mechanical fall this morning around 0600. The patient reports that she thinks she tripped on the rug and hit her head near something by the backdoor. Denies any blurry vision or pain anywhere else. Reports her tetanus is up to date. She reports a headache and stiff neck. Denies any LOC.   Review of Systems  Positive:  Negative:  Physical Exam  BP (!) 111/58 (BP Location: Left Arm)   Pulse 80   Temp 98.9 F (37.2 C) (Oral)   Resp 16   SpO2 94%  Gen:   Awake, no distress   Resp:  Normal effort  MSK:   Moves extremities without difficulty  Other:  Laceration is above her left eye. No midline or paraspinal cervical tenderness. No step off or deformities seen or palpated.   Medical Decision Making  Medically screening exam initiated at 2:44 PM.  Appropriate orders placed.  Kathrene Sinopoli was informed that the remainder of the evaluation will be completed by another provider, this initial triage assessment does not replace that evaluation, and the importance of remaining in the ED until their evaluation is complete.  Will order CT imaging.    Sherrell Puller, PA-C 02/01/22 1447

## 2022-02-01 NOTE — ED Notes (Signed)
Patient states she is leaving and will retry in the morning

## 2022-02-01 NOTE — ED Triage Notes (Signed)
Pt states she tripped and fell today hitting her head. Pt unsure of what she hit her head on. Lac over left eyebrow. Bandage over eyebrow and bleeding controlled. Denies any dizziness that caused her to fall.

## 2022-02-02 DIAGNOSIS — S0181XA Laceration without foreign body of other part of head, initial encounter: Secondary | ICD-10-CM | POA: Diagnosis not present

## 2022-02-02 DIAGNOSIS — Z23 Encounter for immunization: Secondary | ICD-10-CM | POA: Diagnosis not present

## 2022-02-02 DIAGNOSIS — X58XXXA Exposure to other specified factors, initial encounter: Secondary | ICD-10-CM | POA: Diagnosis not present

## 2022-02-08 DIAGNOSIS — Z4802 Encounter for removal of sutures: Secondary | ICD-10-CM | POA: Diagnosis not present

## 2022-03-26 DIAGNOSIS — L400 Psoriasis vulgaris: Secondary | ICD-10-CM | POA: Diagnosis not present

## 2022-03-26 DIAGNOSIS — Z79899 Other long term (current) drug therapy: Secondary | ICD-10-CM | POA: Diagnosis not present

## 2022-03-26 DIAGNOSIS — L820 Inflamed seborrheic keratosis: Secondary | ICD-10-CM | POA: Diagnosis not present

## 2022-04-07 DIAGNOSIS — L400 Psoriasis vulgaris: Secondary | ICD-10-CM | POA: Diagnosis not present

## 2022-04-16 DIAGNOSIS — Z6820 Body mass index (BMI) 20.0-20.9, adult: Secondary | ICD-10-CM | POA: Diagnosis not present

## 2022-04-16 DIAGNOSIS — U071 COVID-19: Secondary | ICD-10-CM | POA: Diagnosis not present

## 2022-07-30 DIAGNOSIS — Z23 Encounter for immunization: Secondary | ICD-10-CM | POA: Diagnosis not present

## 2022-08-04 DIAGNOSIS — H2513 Age-related nuclear cataract, bilateral: Secondary | ICD-10-CM | POA: Diagnosis not present

## 2022-08-04 DIAGNOSIS — H52203 Unspecified astigmatism, bilateral: Secondary | ICD-10-CM | POA: Diagnosis not present

## 2022-08-26 DIAGNOSIS — L400 Psoriasis vulgaris: Secondary | ICD-10-CM | POA: Diagnosis not present

## 2022-08-28 DIAGNOSIS — Z23 Encounter for immunization: Secondary | ICD-10-CM | POA: Diagnosis not present

## 2022-09-27 DIAGNOSIS — D1801 Hemangioma of skin and subcutaneous tissue: Secondary | ICD-10-CM | POA: Diagnosis not present

## 2022-09-27 DIAGNOSIS — L821 Other seborrheic keratosis: Secondary | ICD-10-CM | POA: Diagnosis not present

## 2022-09-27 DIAGNOSIS — D0439 Carcinoma in situ of skin of other parts of face: Secondary | ICD-10-CM | POA: Diagnosis not present

## 2022-09-27 DIAGNOSIS — L814 Other melanin hyperpigmentation: Secondary | ICD-10-CM | POA: Diagnosis not present

## 2022-09-27 DIAGNOSIS — L400 Psoriasis vulgaris: Secondary | ICD-10-CM | POA: Diagnosis not present

## 2022-09-27 DIAGNOSIS — L84 Corns and callosities: Secondary | ICD-10-CM | POA: Diagnosis not present

## 2022-10-13 DIAGNOSIS — L400 Psoriasis vulgaris: Secondary | ICD-10-CM | POA: Diagnosis not present

## 2022-10-13 DIAGNOSIS — Z79899 Other long term (current) drug therapy: Secondary | ICD-10-CM | POA: Diagnosis not present

## 2022-10-13 DIAGNOSIS — L821 Other seborrheic keratosis: Secondary | ICD-10-CM | POA: Diagnosis not present

## 2022-10-19 DIAGNOSIS — D0439 Carcinoma in situ of skin of other parts of face: Secondary | ICD-10-CM | POA: Diagnosis not present

## 2022-11-08 DIAGNOSIS — M19032 Primary osteoarthritis, left wrist: Secondary | ICD-10-CM | POA: Diagnosis not present

## 2022-11-08 DIAGNOSIS — M79602 Pain in left arm: Secondary | ICD-10-CM | POA: Diagnosis not present

## 2022-12-14 DIAGNOSIS — Z1231 Encounter for screening mammogram for malignant neoplasm of breast: Secondary | ICD-10-CM | POA: Diagnosis not present

## 2022-12-28 DIAGNOSIS — L400 Psoriasis vulgaris: Secondary | ICD-10-CM | POA: Diagnosis not present

## 2023-01-18 DIAGNOSIS — E785 Hyperlipidemia, unspecified: Secondary | ICD-10-CM | POA: Diagnosis not present

## 2023-01-18 DIAGNOSIS — E559 Vitamin D deficiency, unspecified: Secondary | ICD-10-CM | POA: Diagnosis not present

## 2023-01-25 DIAGNOSIS — G4700 Insomnia, unspecified: Secondary | ICD-10-CM | POA: Diagnosis not present

## 2023-01-25 DIAGNOSIS — H9193 Unspecified hearing loss, bilateral: Secondary | ICD-10-CM | POA: Diagnosis not present

## 2023-01-25 DIAGNOSIS — Z0000 Encounter for general adult medical examination without abnormal findings: Secondary | ICD-10-CM | POA: Diagnosis not present

## 2023-01-25 DIAGNOSIS — Z1331 Encounter for screening for depression: Secondary | ICD-10-CM | POA: Diagnosis not present

## 2023-01-25 DIAGNOSIS — E559 Vitamin D deficiency, unspecified: Secondary | ICD-10-CM | POA: Diagnosis not present

## 2023-01-25 DIAGNOSIS — Z1339 Encounter for screening examination for other mental health and behavioral disorders: Secondary | ICD-10-CM | POA: Diagnosis not present

## 2023-01-25 DIAGNOSIS — M5136 Other intervertebral disc degeneration, lumbar region: Secondary | ICD-10-CM | POA: Diagnosis not present

## 2023-01-25 DIAGNOSIS — F419 Anxiety disorder, unspecified: Secondary | ICD-10-CM | POA: Diagnosis not present

## 2023-01-25 DIAGNOSIS — E785 Hyperlipidemia, unspecified: Secondary | ICD-10-CM | POA: Diagnosis not present

## 2023-01-25 DIAGNOSIS — R82998 Other abnormal findings in urine: Secondary | ICD-10-CM | POA: Diagnosis not present

## 2023-02-01 IMAGING — MR MR LUMBAR SPINE W/O CM
4 of 5 series · 27 of 48 positions shown · non-contrast
Comparison: None.

CLINICAL DATA: Right low back, hip and buttock pain for 3 weeks.

EXAM:
MRI LUMBAR SPINE WITHOUT CONTRAST
TECHNIQUE: Multiplanar, multisequence MR imaging of the lumbar spine was
performed. No intravenous contrast was administered.

[Series 3: T2 · sagittal · 4.0mm · 1.09mm/px · 6 of 17 slices shown (1 of 2)]
[im 1/17]
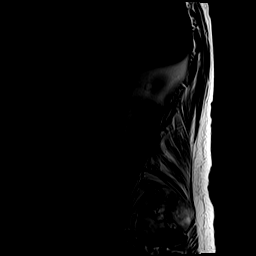
[im 4/17]
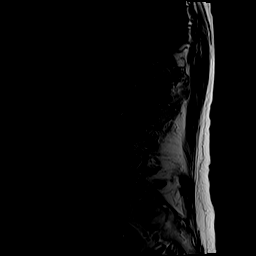
[im 7/17]
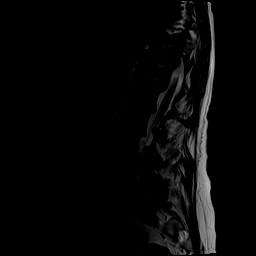
[im 10/17]
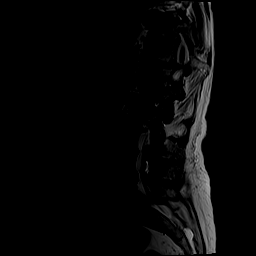
[im 13/17]
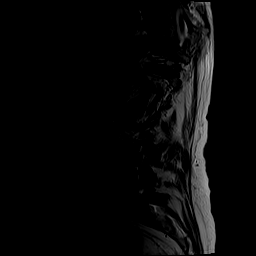
[im 17/17]
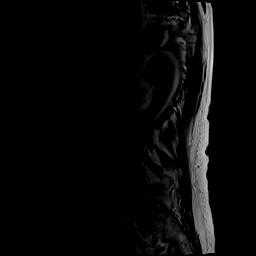

[Series 5: T1 · sagittal · 4.0mm · 1.09mm/px · 7 of 17 slices shown (1 of 2)]
[im 1/17]
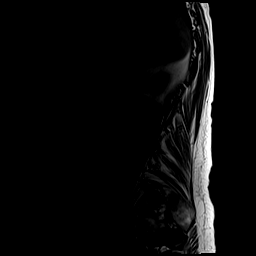
[im 3/17]
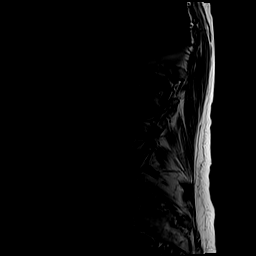
[im 6/17]
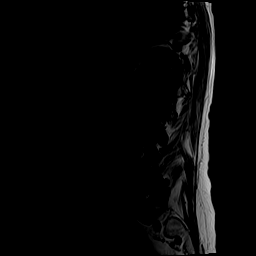
[im 9/17]
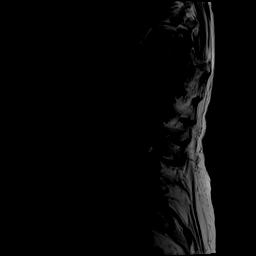
[im 11/17]
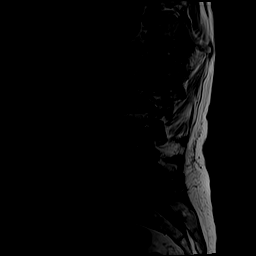
[im 14/17]
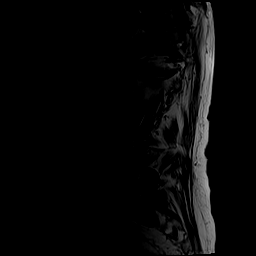
[im 17/17]
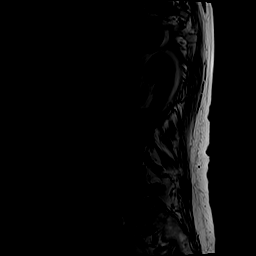

[Series 6: T2 · axial · 4.0mm · 0.39mm/px · z∈[-7,+165]mm · 8 of 34 slices shown (2 of 2)]
[im 1/34]
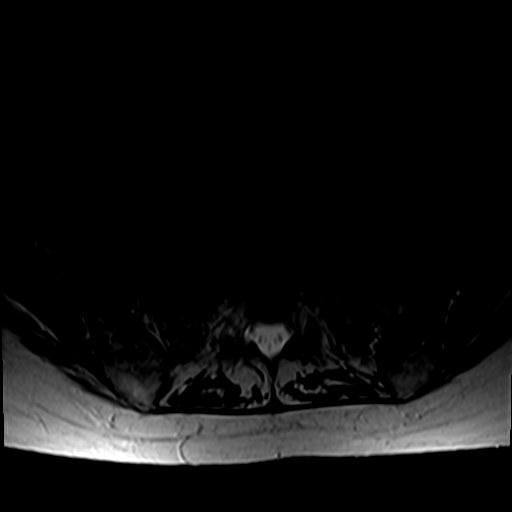
[im 6/34]
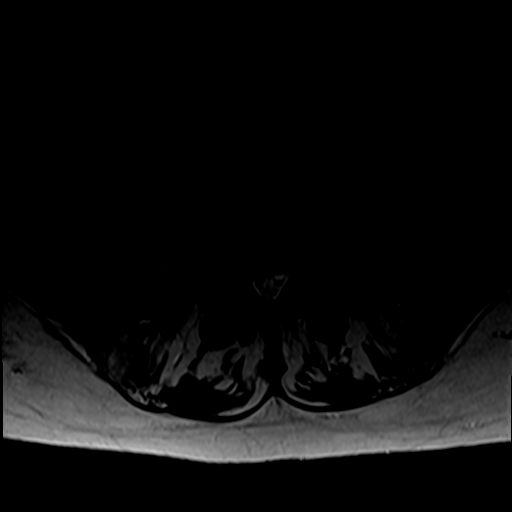
[im 11/34]
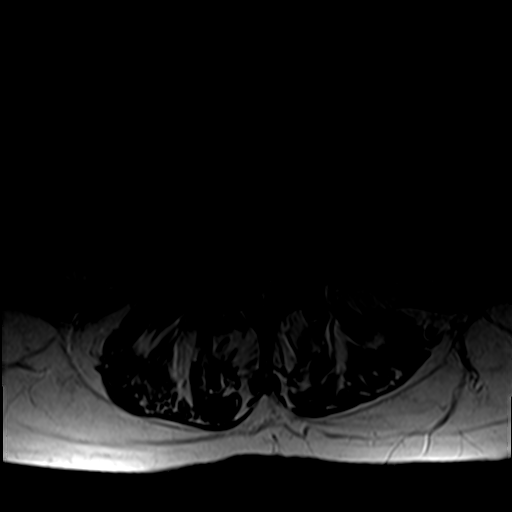
[im 16/34]
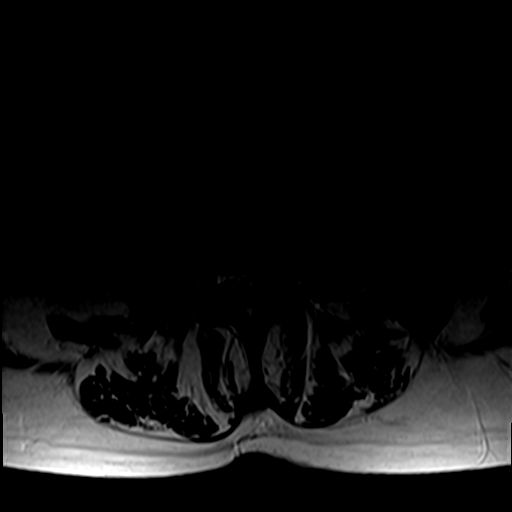
[im 18/34]
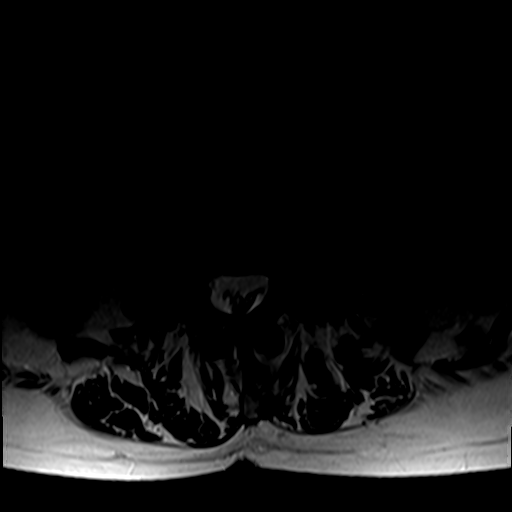
[im 23/34]
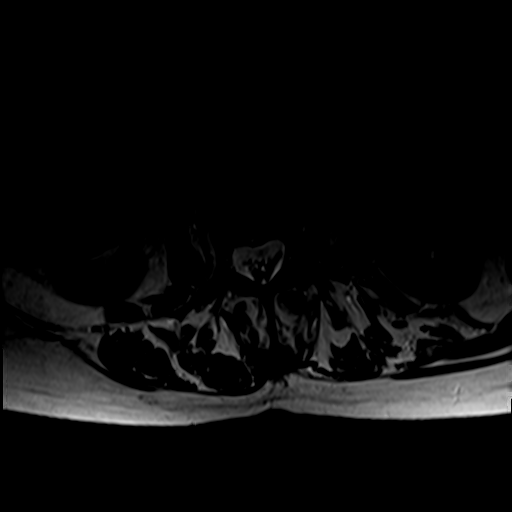
[im 28/34]
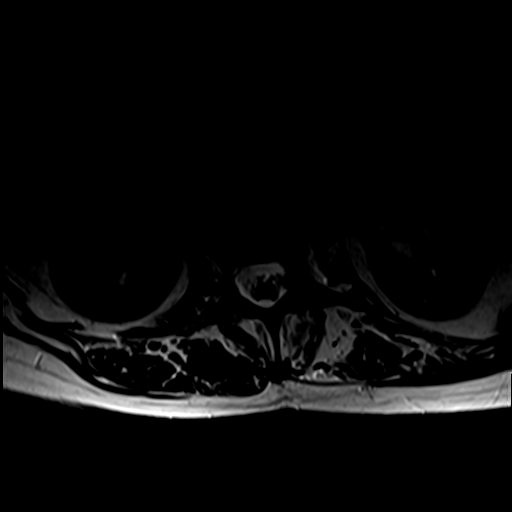
[im 34/34]
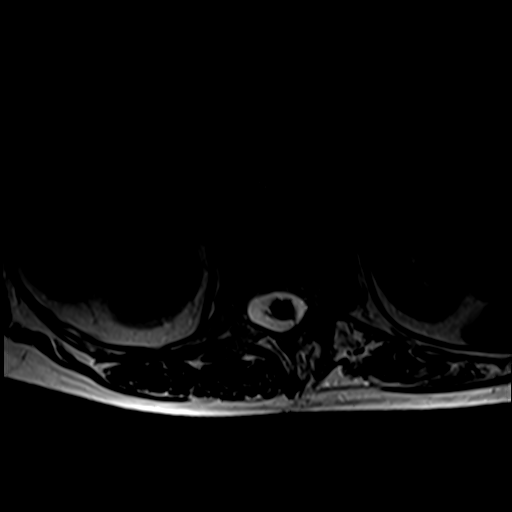

[Series 7: T1 · axial · 4.0mm · 0.39mm/px · z∈[-7,+137]mm · 6 of 34 slices shown (2 of 2)]
[im 1/34]
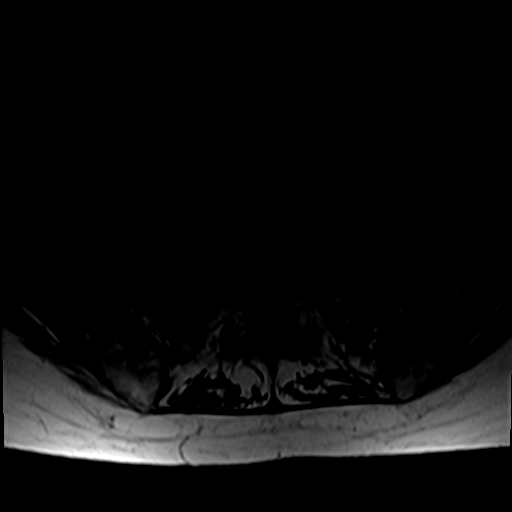
[im 6/34]
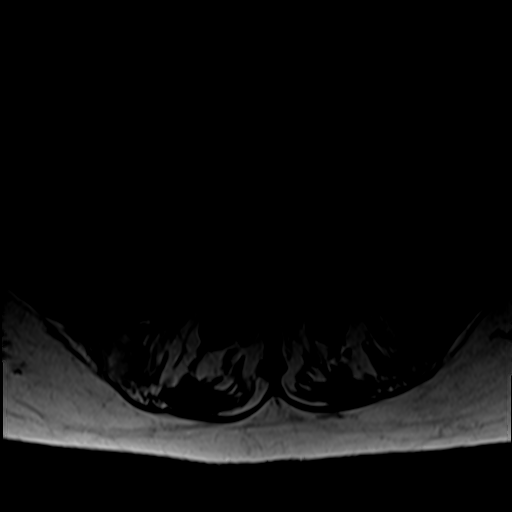
[im 11/34]
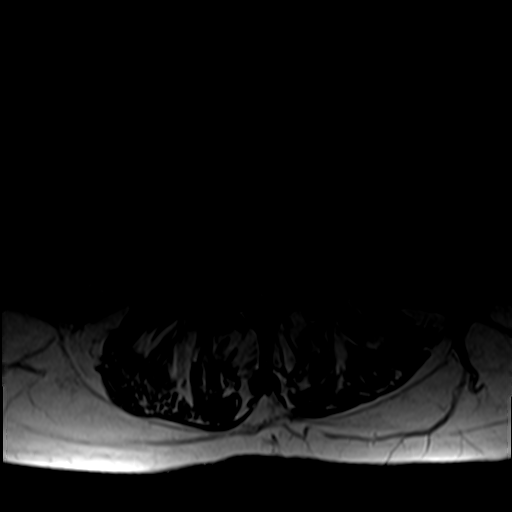
[im 16/34]
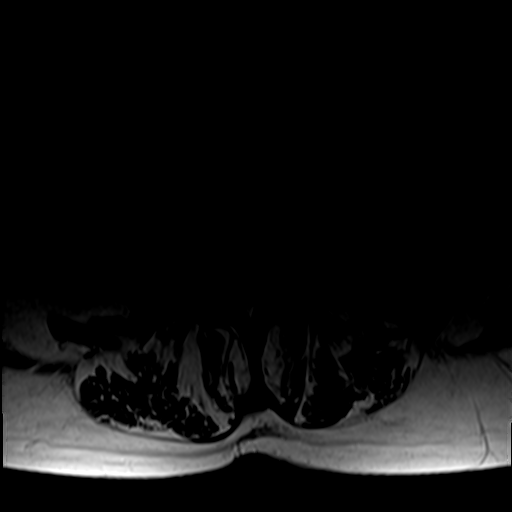
[im 18/34]
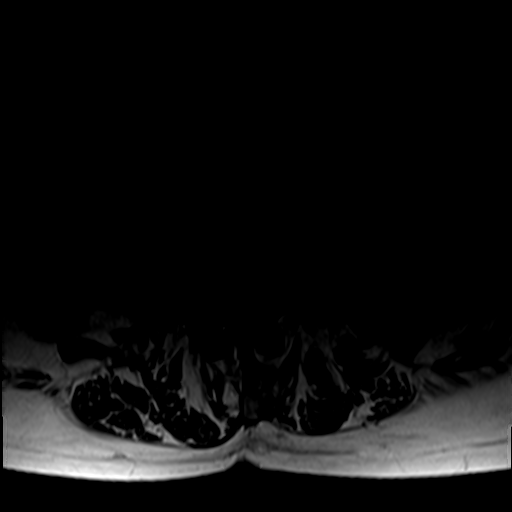
[im 28/34]
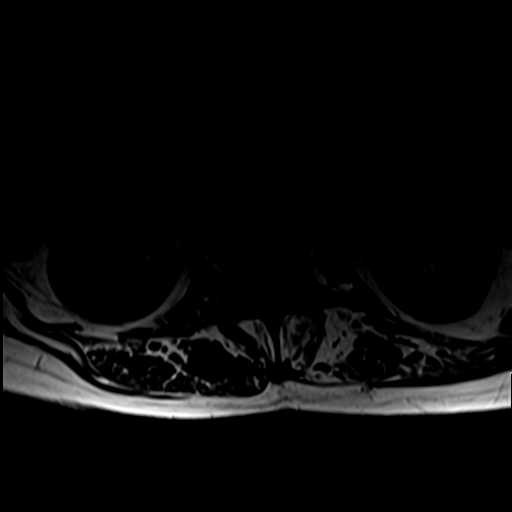

[27 of 48 positions shown; findings below may reference images not displayed]

FINDINGS: Segmentation:  Standard.

Alignment: Lumbar dextrocurvature. There is 5 mm grade 1
anterolisthesis L5 on S1. Trace retrolisthesis at T12-L1, L1-2, and
L2-3.

Vertebrae:  No fracture, evidence of discitis, or bone lesion.

Conus medullaris and cauda equina: Conus extends to the L2 level.
Conus and cauda equina appear normal.

Paraspinal and other soft tissues: Negative.

Disc levels:

T12-L1: Disc osteophyte complex with left paracentral protrusion.
Mild left facet arthropathy. Severe left foraminal stenosis without
significant canal stenosis.

L1-L2: Diffuse disc osteophyte complex with left greater than right
facet arthropathy. Moderate bilateral foraminal stenosis. Mild canal
stenosis.

L2-L3: Disc osteophyte complex with small central caudally extending
disc protrusion. Mild bilateral facet arthropathy. Mild canal
stenosis with mild-moderate bilateral foraminal stenosis, left worse
than right.

L3-L4: Disc osteophyte complex with moderate bilateral facet
arthropathy and ligamentum flavum buckling. Findings result in
mild-to-moderate canal stenosis with mild-moderate left and mild
right foraminal stenosis.

L4-L5: Diffuse disc bulge with right paracentral protrusion. Severe
bilateral facet arthropathy with ligamentum flavum buckling.
Findings result in mild-moderate canal stenosis with moderate right
foraminal stenosis.

L5-S1: Anterolisthesis with disc uncovering and right foraminal disc
protrusion. Severe bilateral facet arthropathy. Severe bilateral
subarticular recess stenosis and severe right foraminal stenosis.
Mild left foraminal stenosis. Mild canal stenosis.
IMPRESSION: 1. Advanced multilevel lumbar spondylosis including grade 1
anterolisthesis of L5 on S1.
2. Severe bilateral subarticular recess stenosis and severe right
foraminal stenosis at L5-S1.
3. Mild-to-moderate canal stenosis at L3-4 and L4-5.
4. Severe left foraminal stenosis at T12-L1.

## 2023-03-29 DIAGNOSIS — M5136 Other intervertebral disc degeneration, lumbar region: Secondary | ICD-10-CM | POA: Diagnosis not present

## 2023-03-29 DIAGNOSIS — M8580 Other specified disorders of bone density and structure, unspecified site: Secondary | ICD-10-CM | POA: Diagnosis not present

## 2023-03-29 DIAGNOSIS — Z23 Encounter for immunization: Secondary | ICD-10-CM | POA: Diagnosis not present

## 2023-03-29 DIAGNOSIS — G4700 Insomnia, unspecified: Secondary | ICD-10-CM | POA: Diagnosis not present

## 2023-03-29 DIAGNOSIS — M419 Scoliosis, unspecified: Secondary | ICD-10-CM | POA: Diagnosis not present

## 2023-04-27 DIAGNOSIS — L400 Psoriasis vulgaris: Secondary | ICD-10-CM | POA: Diagnosis not present

## 2023-05-06 DIAGNOSIS — Z23 Encounter for immunization: Secondary | ICD-10-CM | POA: Diagnosis not present

## 2023-06-03 DIAGNOSIS — L400 Psoriasis vulgaris: Secondary | ICD-10-CM | POA: Diagnosis not present

## 2023-06-03 DIAGNOSIS — L821 Other seborrheic keratosis: Secondary | ICD-10-CM | POA: Diagnosis not present

## 2023-06-03 DIAGNOSIS — Z79899 Other long term (current) drug therapy: Secondary | ICD-10-CM | POA: Diagnosis not present

## 2023-08-12 DIAGNOSIS — H2513 Age-related nuclear cataract, bilateral: Secondary | ICD-10-CM | POA: Diagnosis not present

## 2023-08-12 DIAGNOSIS — H52203 Unspecified astigmatism, bilateral: Secondary | ICD-10-CM | POA: Diagnosis not present

## 2023-08-24 DIAGNOSIS — L400 Psoriasis vulgaris: Secondary | ICD-10-CM | POA: Diagnosis not present

## 2023-09-22 DIAGNOSIS — N3941 Urge incontinence: Secondary | ICD-10-CM | POA: Diagnosis not present

## 2023-09-22 DIAGNOSIS — F32A Depression, unspecified: Secondary | ICD-10-CM | POA: Diagnosis not present

## 2023-09-22 DIAGNOSIS — R0982 Postnasal drip: Secondary | ICD-10-CM | POA: Diagnosis not present

## 2023-12-07 DIAGNOSIS — L821 Other seborrheic keratosis: Secondary | ICD-10-CM | POA: Diagnosis not present

## 2023-12-07 DIAGNOSIS — L570 Actinic keratosis: Secondary | ICD-10-CM | POA: Diagnosis not present

## 2023-12-07 DIAGNOSIS — D1801 Hemangioma of skin and subcutaneous tissue: Secondary | ICD-10-CM | POA: Diagnosis not present

## 2023-12-07 DIAGNOSIS — Z85828 Personal history of other malignant neoplasm of skin: Secondary | ICD-10-CM | POA: Diagnosis not present

## 2023-12-07 DIAGNOSIS — L400 Psoriasis vulgaris: Secondary | ICD-10-CM | POA: Diagnosis not present

## 2023-12-20 DIAGNOSIS — Z1231 Encounter for screening mammogram for malignant neoplasm of breast: Secondary | ICD-10-CM | POA: Diagnosis not present

## 2023-12-23 DIAGNOSIS — L400 Psoriasis vulgaris: Secondary | ICD-10-CM | POA: Diagnosis not present

## 2024-01-12 DIAGNOSIS — T148XXA Other injury of unspecified body region, initial encounter: Secondary | ICD-10-CM | POA: Diagnosis not present

## 2024-01-12 DIAGNOSIS — L98491 Non-pressure chronic ulcer of skin of other sites limited to breakdown of skin: Secondary | ICD-10-CM | POA: Diagnosis not present

## 2024-01-24 DIAGNOSIS — E7849 Other hyperlipidemia: Secondary | ICD-10-CM | POA: Diagnosis not present

## 2024-01-30 DIAGNOSIS — E785 Hyperlipidemia, unspecified: Secondary | ICD-10-CM | POA: Diagnosis not present

## 2024-01-30 DIAGNOSIS — E559 Vitamin D deficiency, unspecified: Secondary | ICD-10-CM | POA: Diagnosis not present

## 2024-01-31 DIAGNOSIS — Z1339 Encounter for screening examination for other mental health and behavioral disorders: Secondary | ICD-10-CM | POA: Diagnosis not present

## 2024-01-31 DIAGNOSIS — Z0000 Encounter for general adult medical examination without abnormal findings: Secondary | ICD-10-CM | POA: Diagnosis not present

## 2024-01-31 DIAGNOSIS — E785 Hyperlipidemia, unspecified: Secondary | ICD-10-CM | POA: Diagnosis not present

## 2024-01-31 DIAGNOSIS — E559 Vitamin D deficiency, unspecified: Secondary | ICD-10-CM | POA: Diagnosis not present

## 2024-01-31 DIAGNOSIS — F419 Anxiety disorder, unspecified: Secondary | ICD-10-CM | POA: Diagnosis not present

## 2024-01-31 DIAGNOSIS — R82998 Other abnormal findings in urine: Secondary | ICD-10-CM | POA: Diagnosis not present

## 2024-01-31 DIAGNOSIS — F3289 Other specified depressive episodes: Secondary | ICD-10-CM | POA: Diagnosis not present

## 2024-01-31 DIAGNOSIS — L409 Psoriasis, unspecified: Secondary | ICD-10-CM | POA: Diagnosis not present

## 2024-01-31 DIAGNOSIS — Z1331 Encounter for screening for depression: Secondary | ICD-10-CM | POA: Diagnosis not present

## 2024-01-31 DIAGNOSIS — R2681 Unsteadiness on feet: Secondary | ICD-10-CM | POA: Diagnosis not present

## 2024-01-31 DIAGNOSIS — N3281 Overactive bladder: Secondary | ICD-10-CM | POA: Diagnosis not present

## 2024-02-20 DIAGNOSIS — N390 Urinary tract infection, site not specified: Secondary | ICD-10-CM | POA: Diagnosis not present

## 2024-02-20 DIAGNOSIS — R319 Hematuria, unspecified: Secondary | ICD-10-CM | POA: Diagnosis not present

## 2024-02-27 DIAGNOSIS — R319 Hematuria, unspecified: Secondary | ICD-10-CM | POA: Diagnosis not present

## 2024-02-27 DIAGNOSIS — N390 Urinary tract infection, site not specified: Secondary | ICD-10-CM | POA: Diagnosis not present

## 2024-03-02 DIAGNOSIS — I872 Venous insufficiency (chronic) (peripheral): Secondary | ICD-10-CM | POA: Diagnosis not present

## 2024-03-02 DIAGNOSIS — L989 Disorder of the skin and subcutaneous tissue, unspecified: Secondary | ICD-10-CM | POA: Diagnosis not present

## 2024-03-27 DIAGNOSIS — L309 Dermatitis, unspecified: Secondary | ICD-10-CM | POA: Diagnosis not present

## 2024-04-23 DIAGNOSIS — L400 Psoriasis vulgaris: Secondary | ICD-10-CM | POA: Diagnosis not present

## 2024-05-08 DIAGNOSIS — L570 Actinic keratosis: Secondary | ICD-10-CM | POA: Diagnosis not present

## 2024-05-08 DIAGNOSIS — L821 Other seborrheic keratosis: Secondary | ICD-10-CM | POA: Diagnosis not present
